# Patient Record
Sex: Female | Born: 1975 | Race: Black or African American | Hispanic: No | Smoking: Never smoker
Health system: Southern US, Community
[De-identification: ages and names within clinical notes are randomized; demographics above are authoritative.]

## PROBLEM LIST (undated history)

## (undated) DIAGNOSIS — E669 Obesity, unspecified: Secondary | ICD-10-CM

## (undated) DIAGNOSIS — F329 Major depressive disorder, single episode, unspecified: Secondary | ICD-10-CM

## (undated) DIAGNOSIS — D649 Anemia, unspecified: Secondary | ICD-10-CM

## (undated) DIAGNOSIS — J45909 Unspecified asthma, uncomplicated: Secondary | ICD-10-CM

## (undated) DIAGNOSIS — R519 Headache, unspecified: Secondary | ICD-10-CM

## (undated) DIAGNOSIS — G510 Bell's palsy: Secondary | ICD-10-CM

## (undated) DIAGNOSIS — R51 Headache: Secondary | ICD-10-CM

## (undated) DIAGNOSIS — Z8619 Personal history of other infectious and parasitic diseases: Secondary | ICD-10-CM

## (undated) DIAGNOSIS — G43909 Migraine, unspecified, not intractable, without status migrainosus: Secondary | ICD-10-CM

## (undated) DIAGNOSIS — F32A Depression, unspecified: Secondary | ICD-10-CM

## (undated) DIAGNOSIS — E559 Vitamin D deficiency, unspecified: Secondary | ICD-10-CM

## (undated) DIAGNOSIS — F419 Anxiety disorder, unspecified: Secondary | ICD-10-CM

## (undated) HISTORY — DX: Headache, unspecified: R51.9

## (undated) HISTORY — PX: COLONOSCOPY: SHX174

## (undated) HISTORY — DX: Unspecified asthma, uncomplicated: J45.909

## (undated) HISTORY — DX: Anxiety disorder, unspecified: F41.9

## (undated) HISTORY — DX: Headache: R51

## (undated) HISTORY — DX: Migraine, unspecified, not intractable, without status migrainosus: G43.909

## (undated) HISTORY — DX: Anemia, unspecified: D64.9

## (undated) HISTORY — DX: Bell's palsy: G51.0

## (undated) HISTORY — DX: Obesity, unspecified: E66.9

## (undated) HISTORY — DX: Personal history of other infectious and parasitic diseases: Z86.19

## (undated) HISTORY — DX: Vitamin D deficiency, unspecified: E55.9

## (undated) HISTORY — DX: Major depressive disorder, single episode, unspecified: F32.9

## (undated) HISTORY — DX: Depression, unspecified: F32.A

---

## 1999-11-25 ENCOUNTER — Inpatient Hospital Stay (HOSPITAL_COMMUNITY): Admission: AD | Admit: 1999-11-25 | Discharge: 1999-11-25 | Payer: Self-pay | Admitting: *Deleted

## 1999-11-28 ENCOUNTER — Inpatient Hospital Stay (HOSPITAL_COMMUNITY): Admission: AD | Admit: 1999-11-28 | Discharge: 1999-11-28 | Payer: Self-pay | Admitting: Obstetrics & Gynecology

## 1999-12-07 ENCOUNTER — Inpatient Hospital Stay (HOSPITAL_COMMUNITY): Admission: AD | Admit: 1999-12-07 | Discharge: 1999-12-07 | Payer: Self-pay | Admitting: *Deleted

## 2000-02-28 ENCOUNTER — Ambulatory Visit (HOSPITAL_COMMUNITY): Admission: RE | Admit: 2000-02-28 | Discharge: 2000-02-28 | Payer: Self-pay | Admitting: *Deleted

## 2000-04-04 ENCOUNTER — Ambulatory Visit (HOSPITAL_COMMUNITY): Admission: RE | Admit: 2000-04-04 | Discharge: 2000-04-04 | Payer: Self-pay | Admitting: *Deleted

## 2001-03-15 ENCOUNTER — Inpatient Hospital Stay (HOSPITAL_COMMUNITY): Admission: AD | Admit: 2001-03-15 | Discharge: 2001-03-15 | Payer: Self-pay | Admitting: Obstetrics & Gynecology

## 2001-06-03 ENCOUNTER — Other Ambulatory Visit: Admission: RE | Admit: 2001-06-03 | Discharge: 2001-06-03 | Payer: Self-pay | Admitting: Obstetrics

## 2001-06-03 ENCOUNTER — Encounter (INDEPENDENT_AMBULATORY_CARE_PROVIDER_SITE_OTHER): Payer: Self-pay | Admitting: Specialist

## 2005-10-09 ENCOUNTER — Other Ambulatory Visit: Admission: RE | Admit: 2005-10-09 | Discharge: 2005-10-09 | Payer: Self-pay | Admitting: Obstetrics and Gynecology

## 2005-11-23 ENCOUNTER — Emergency Department (HOSPITAL_COMMUNITY): Admission: EM | Admit: 2005-11-23 | Discharge: 2005-11-23 | Payer: Self-pay | Admitting: Emergency Medicine

## 2005-11-24 ENCOUNTER — Emergency Department (HOSPITAL_COMMUNITY): Admission: EM | Admit: 2005-11-24 | Discharge: 2005-11-24 | Payer: Self-pay | Admitting: Family Medicine

## 2005-11-24 ENCOUNTER — Ambulatory Visit (HOSPITAL_COMMUNITY): Admission: RE | Admit: 2005-11-24 | Discharge: 2005-11-24 | Payer: Self-pay | Admitting: Family Medicine

## 2007-09-10 ENCOUNTER — Encounter: Admission: RE | Admit: 2007-09-10 | Discharge: 2007-09-10 | Payer: Self-pay | Admitting: Gastroenterology

## 2007-10-27 ENCOUNTER — Emergency Department (HOSPITAL_COMMUNITY): Admission: EM | Admit: 2007-10-27 | Discharge: 2007-10-27 | Payer: Self-pay | Admitting: Emergency Medicine
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
## 2010-02-17 ENCOUNTER — Encounter: Admission: RE | Admit: 2010-02-17 | Discharge: 2010-03-17 | Payer: Self-pay | Admitting: Obstetrics and Gynecology

## 2010-06-09 ENCOUNTER — Inpatient Hospital Stay (HOSPITAL_COMMUNITY): Admission: RE | Admit: 2010-06-09 | Discharge: 2010-06-12 | Payer: Self-pay | Admitting: Obstetrics and Gynecology

## 2011-01-14 ENCOUNTER — Encounter: Payer: Self-pay | Admitting: Internal Medicine

## 2011-03-13 LAB — CBC
HCT: 28.1 % — ABNORMAL LOW (ref 36.0–46.0)
MCHC: 33.6 g/dL (ref 30.0–36.0)
Platelets: 156 10*3/uL (ref 150–400)
RBC: 3.29 MIL/uL — ABNORMAL LOW (ref 3.87–5.11)
RBC: 3.86 MIL/uL — ABNORMAL LOW (ref 3.87–5.11)
RDW: 14.8 % (ref 11.5–15.5)
WBC: 9.1 10*3/uL (ref 4.0–10.5)

## 2011-03-13 LAB — RPR: RPR Ser Ql: NONREACTIVE

## 2011-03-13 LAB — CCBB MATERNAL DONOR DRAW

## 2013-01-16 ENCOUNTER — Other Ambulatory Visit: Payer: Self-pay

## 2013-01-16 ENCOUNTER — Telehealth: Payer: Self-pay | Admitting: Obstetrics and Gynecology

## 2013-01-16 NOTE — Telephone Encounter (Signed)
Lm for pt to call back

## 2013-01-16 NOTE — Telephone Encounter (Signed)
TC TO PT. RE: B/C. PT STATES HER INSURANCE IS CHANGING TO MAIL ORDER AND I GAVE PT SAMPLES UNTIL AEX. PT APPRECIATED AND I TOLD PT TO MAKE SURE SHE KEEP AEX EXAM. PT VOICED UNDERSTANDING.

## 2013-02-24 ENCOUNTER — Ambulatory Visit: Payer: Self-pay | Admitting: Obstetrics and Gynecology

## 2013-02-26 ENCOUNTER — Ambulatory Visit: Payer: Self-pay | Admitting: Obstetrics and Gynecology

## 2013-03-17 ENCOUNTER — Other Ambulatory Visit: Payer: Self-pay | Admitting: Obstetrics and Gynecology

## 2013-03-18 LAB — HIV ANTIBODY (ROUTINE TESTING W REFLEX): HIV: NONREACTIVE

## 2013-03-18 LAB — RPR

## 2013-03-18 LAB — HEPATITIS C ANTIBODY: HCV Ab: NEGATIVE

## 2013-03-19 LAB — PAP IG, CT-NG, RFX HPV ASCU
Chlamydia Probe Amp: NEGATIVE
GC Probe Amp: NEGATIVE

## 2013-03-25 DIAGNOSIS — G510 Bell's palsy: Secondary | ICD-10-CM

## 2013-03-25 HISTORY — DX: Bell's palsy: G51.0

## 2013-04-14 ENCOUNTER — Other Ambulatory Visit: Payer: Self-pay

## 2013-04-14 ENCOUNTER — Other Ambulatory Visit: Payer: Self-pay | Admitting: Nurse Practitioner

## 2013-04-14 DIAGNOSIS — R51 Headache: Secondary | ICD-10-CM

## 2013-04-15 ENCOUNTER — Inpatient Hospital Stay: Admission: RE | Admit: 2013-04-15 | Payer: Self-pay | Source: Ambulatory Visit

## 2013-04-16 ENCOUNTER — Encounter: Payer: Self-pay | Admitting: Neurology

## 2013-04-16 ENCOUNTER — Ambulatory Visit (INDEPENDENT_AMBULATORY_CARE_PROVIDER_SITE_OTHER): Payer: BC Managed Care – PPO | Admitting: Neurology

## 2013-04-16 VITALS — BP 100/60 | HR 60 | Temp 98.1°F | Resp 12 | Ht 67.0 in | Wt 220.0 lb

## 2013-04-16 DIAGNOSIS — G510 Bell's palsy: Secondary | ICD-10-CM

## 2013-04-16 DIAGNOSIS — M5010 Cervical disc disorder with radiculopathy, unspecified cervical region: Secondary | ICD-10-CM

## 2013-04-16 DIAGNOSIS — M5412 Radiculopathy, cervical region: Secondary | ICD-10-CM

## 2013-04-16 MED ORDER — HYDROCODONE-ACETAMINOPHEN 5-325 MG PO TABS: 1.0000 | ORAL_TABLET | Freq: Four times a day (QID) | ORAL | Status: DC | PRN

## 2013-04-16 NOTE — Progress Notes (Signed)
Terry Ferguson is a 37 year old woman who has gone through a very stressful period of her life in recent months.  She notes that for the past 2 months she has had pain in her neck radiating into both shoulder areas.  In the past 6 weeks she has had intermittent tingling and numbness in the left arm that last for a couple of minutes this happened about 3 times in the last month.  One week ago she awoke and she noticed she couldn't swish mouthwash in her mouth and her mouth was open and quite correctly. Within a couple of hours, she couldn't talk correctly in the left side of the face was paralyzed.  She saw her dentist who sent her to the oral surgeon and they both gave her the diagnosis of Bell's palsy.  She started a six-day pack that day or the next day which he finished yesterday.  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  In the last today she's had pain behind the left ear radiating into the face and into the neck.  She was as called in to begin 60 mg of prednisone with taper as well as acyclovir.  She hasn't taken her first pill but she has it and is able to take it now or at lunch.  She also has loss of taste and a left-sided time and noises do seem upper and left ear, which would confirm that this is a facial nerve problem and not a brain originating the facial paralysis.  Also the involvement of the upper and lower face indicates this is a facial nerve problem and not a brain originating facial paralysis.  Because of the pain, she is having difficulty sleeping the last 2 nights and she can't lay on the left side because of this.  Review of systems is positive for occasional headaches and migraines as well as neck pain and occasional indigestion. She's also been having some stress feeling there was in her grandmother and other difficult situations that she's  been through. The remainder of review of systems is negative.  Past Medical History  Diagnosis Date  . Bell's palsy 03-2013    No current outpatient prescriptions on file prior to visit.   No current facility-administered medications on file prior to visit.   Erythromycin allergies History   Social History  . Marital Status: Married    Spouse Name: N/A    Number of Children: N/A  . Years of Education: N/A   Occupational History  . Not on file.   Social History Main Topics  . Smoking status: Never Smoker   . Smokeless tobacco: Never Used  . Alcohol Use: No     Comment: socially  . Drug Use: No  . Sexually Active: Not on file   Other Topics Concern  . Not on file   Social History Narrative  . No narrative on file   No family history on file.   BP 100/60  Pulse 60  Temp(Src) 98.1 F (36.7 C)  Resp 12  Ht 5\' 7"  (1.702 m)  Wt 220 lb (99.791 kg)  BMI 34.45 kg/m2   Alert and oriented x 3.  Memory function appears to be intact.  Concentration and attention are normal for educational level and background.  Speech is fluent and without significant word finding difficulty.  Is aware of current events.  No carotid bruits detected.  Cranial nerve II through XII reveals complete paraylsis of the left facial muscles in all divisions. normal optic discs and acuity, EOMI, PERLA, facialsensation intact, hearing grossly intact, gag intact,Uvula raises symmetrically and tongue protrudes evenly. Motor strength is 5 over 5 throughout all limbs.  No atrophy, abnormal tone or tremors. Reflexes are 2+ and symmetric in the upper and lower extremities Sensory exam is intact. Coordination is intact for fine movements and rapid alternating movements in all limbs Gait and station are normal.   Impression: 1.  Neck pain and intermittent tingling of the left arm suggesting possible cervical radiculopathy or cervical disc syndrome of some type. 2. Acute Bell's palsy one week in duration but  pain RE increasing even on a tapering dose of steroids.  She is complete facial paralysis and the recurrence of the pain would indicate significant swelling within the mastoid area of the cranium.  Plan: 1. She will resume steroids at 60 mg today with a taper 2. She will take Valtrex prescription beginning now. 3. We will take her out of work for 2 weeks due to the nature of her work requiring near continuous speech. 4. She'll see Dr. Lester Kinsman at 1:30 for acupuncture. 5. Return in 2 weeks

## 2013-04-16 NOTE — Patient Instructions (Addendum)
Follow up with Dr. Smiley Houseman in 2 weeks.  Acupuncture today at 1:30 pm as discussed.

## 2013-04-30 ENCOUNTER — Ambulatory Visit: Payer: BC Managed Care – PPO | Admitting: Neurology

## 2013-04-30 ENCOUNTER — Encounter: Payer: Self-pay | Admitting: Neurology

## 2013-04-30 ENCOUNTER — Ambulatory Visit (INDEPENDENT_AMBULATORY_CARE_PROVIDER_SITE_OTHER): Payer: BC Managed Care – PPO | Admitting: Neurology

## 2013-04-30 VITALS — BP 108/60 | HR 70 | Temp 98.0°F | Resp 12 | Wt 221.0 lb

## 2013-04-30 DIAGNOSIS — G510 Bell's palsy: Secondary | ICD-10-CM

## 2013-04-30 NOTE — Patient Instructions (Addendum)
Follow up with Dr. Soo in 6 weeks. 

## 2013-04-30 NOTE — Progress Notes (Signed)
Two-week followup for this case of severe left sided Bell's palsy.  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  She did go for one visit of acupuncture but has not been able to go back to shift. She was recommended to have a few visits for the best chance of improving her outcome.  She finished her second dose of steroids and she finished her Valtrex.  She has no more pain in the left although the face feels funny and her tongue feels a little too early in the left side.  She is having some right-sided headaches now and her TMJ joint pops once in a while.  Her vision cannula bit blurry. She is using a patch at night but no tape over the either Lacri-Lube yet.  She has paperwork for her disability and she also had lab work that was done on the outside including normal Lyme titers, sedimentation rate, CBC and her vitamin D level was low.  Review of systems is positive for some depression and anxiety and sleeping trouble.  Remainder of review of systems is unremarkable.  Past Medical History  Diagnosis Date  . Bell's palsy 03-2013  . Vitamin D deficiency     Current Outpatient Prescriptions on File Prior to Visit  Medication Sig Dispense Refill  . etonogestrel-ethinyl estradiol (NUVARING) 0.12-0.015 MG/24HR vaginal ring Place 1 each vaginally every 28 (twenty-eight) days. Insert vaginally and leave in place for 3 consecutive weeks, then remove for 1 week.      . Multiple Vitamin (MULTIVITAMIN) capsule Take 1 capsule by mouth daily.      . Probiotic Product (PROBIOTIC DAILY PO) Take by mouth.      Marland Kitchen HYDROcodone-acetaminophen (NORCO/VICODIN) 5-325 MG per tablet Take 1 tablet by mouth every 6 (six) hours as needed for pain.  30 tablet  0  . predniSONE (DELTASONE) 20 MG tablet Take 20 mg by mouth daily. Take three tabs a day.      . valACYclovir (VALTREX) 1000 MG tablet Take 1,000 mg by mouth 3 (three) times daily.       No current facility-administered medications on  file prior to visit.   Erythromycin  History   Social History  . Marital Status: Married    Spouse Name: N/A    Number of Children: N/A  . Years of Education: N/A   Occupational History  . Not on file.   Social History Main Topics  . Smoking status: Never Smoker   . Smokeless tobacco: Never Used  . Alcohol Use: No     Comment: socially  . Drug Use: No  . Sexually Active: Not on file   Other Topics Concern  . Not on file   Social History Narrative  . No narrative on file   No family history on file.   BP 108/60  Pulse 70  Temp(Src) 98 F (36.7 C)  Resp 12  Wt 221 lb (100.245 kg)  BMI 34.61 kg/m2    Alert and oriented x 3.  Memory function appears to be intact.  Concentration and attention are normal for educational level and background.  Speech is fluent but poorly enunciated for some words and without significant word finding difficulty.  Is aware of current events.  No carotid bruits detected.  Cranial nerve II through XII reveals no movement in the left facial muscles and the face is drawn over to the right. The right-sided facial muscles appear to be working normally at this time. Motor  strength is 5 over 5 throughout all limbs.  No atrophy, abnormal tone or tremors. Reflexes are 2+ and symmetric in the upper and lower extremities Sensory exam is intact. Coordination is intact for fine movements and rapid alternating movements in all limbs Gait and station are normal.   Impression: 1. Severe left Bell's palsy with no signs of increased motor movement as of yet and she still has some difficulty enunciating some words.  Plan: 1. Use Lacri-Lube at night and taper I shut under the patch. 2. Return acupuncture to complete her course of treatment. 3. 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 4. She can try over-the-counter vitamin D3, and she establishes with her primary doctor she can revisit this issue. 5. Return in 6  weeks to see if her speaking problem has improved and her for her facial weakness has improved 2 resulting in a change in her work status

## 2013-06-11 ENCOUNTER — Ambulatory Visit (INDEPENDENT_AMBULATORY_CARE_PROVIDER_SITE_OTHER): Payer: BC Managed Care – PPO | Admitting: Neurology

## 2013-06-11 ENCOUNTER — Telehealth: Payer: Self-pay | Admitting: Neurology

## 2013-06-11 ENCOUNTER — Encounter: Payer: Self-pay | Admitting: Neurology

## 2013-06-11 VITALS — BP 90/60 | HR 62 | Temp 97.6°F | Resp 12 | Wt 222.0 lb

## 2013-06-11 DIAGNOSIS — G510 Bell's palsy: Secondary | ICD-10-CM

## 2013-06-11 NOTE — Patient Instructions (Addendum)
We will see you back in two months.

## 2013-06-11 NOTE — Progress Notes (Signed)
Terry Ferguson returns for followup of her Bell's palsy now going on approximately 2 months.  She started to see some improved symmetry at the mouth and a little bit of improved closing of the eye and movement of the 4 head areas. Taste of the time still often there still is weakness of the left side of the face. She no longer is having a pain in the left mastoid area. She is having some pain in the right TMJ joint area.  Her sister has been reported missing under suspicious circumstances and that is mimicking stress on the family.  She has completed all of her courses of medication for the Bell's palsy but she still is having trouble enunciating her words on the phone and also she is now having a hoarse voice because all the people calling her and they can always hear her responses so she has to repeat herself a lot.  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  Therefore we will plan to extend her leave and hopefully she will be significant recovery within the next 6-8 weeks.  Review of systems is positive for some stress and tension in the shoulders as well as intention and the stomach area and occasional difficulty sleeping and the pain in the right TMJ area.  Past Medical History  Diagnosis Date  . Bell's palsy 03-2013  . Vitamin D deficiency     Current Outpatient Prescriptions on File Prior to Visit  Medication Sig Dispense Refill  . etonogestrel-ethinyl estradiol (NUVARING) 0.12-0.015 MG/24HR vaginal ring Place 1 each vaginally every 28 (twenty-eight) days. Insert vaginally and leave in place for 3 consecutive weeks, then remove for 1 week.      Marland Kitchen ibuprofen (ADVIL,MOTRIN) 800 MG tablet       . Multiple Vitamin (MULTIVITAMIN) capsule Take 1 capsule by mouth daily.      . Probiotic Product (PROBIOTIC DAILY PO) Take by mouth.      Marland Kitchen HYDROcodone-acetaminophen (NORCO/VICODIN) 5-325 MG per tablet Take 1  tablet by mouth every 6 (six) hours as needed for pain.  30 tablet  0   No current facility-administered medications on file prior to visit.    History   Social History  . Marital Status: Married    Spouse Name: N/A    Number of Children: N/A  . Years of Education: N/A   Occupational History  . Not on file.   Social History Main Topics  . Smoking status: Never Smoker   . Smokeless tobacco: Never Used  . Alcohol Use: No     Comment: socially  . Drug Use: No  . Sexually Active: Not on file   Other Topics Concern  . Not on file   Social History Narrative  . No narrative on file   No family history on file. Erythromycin  BP 90/60  Pulse 62  Temp(Src) 97.6 F (36.4 C)  Resp 12  Wt 222 lb (100.699 kg)  BMI 34.76 kg/m2   Alert and oriented x 3.  Memory function appears to be intact.  Concentration and attention are normal for educational level and background.  Speech is fluent and without significant word finding difficulty.  Is aware of current events.  No carotid bruits detected.  Cranial nerve II through XII reveals significant left facial weakness in all branches, but now a definite movement in the forehead muscles, and very slight movement around the left  side of the mouth and in the platysma muscle at only 10%.   normal optic discs and acuity, EOMI, PERLA, facial  sensation intact, hearing grossly intact, gag intact,Uvula raises symmetrically and tongue protrudes evenly. Motor strength is 5 over 5 throughout all limbs.  No atrophy, abnormal tone or tremors. Reflexes are 2+ and symmetric in the upper and lower extremities Sensory exam is intact. Coordination is intact for fine movements and rapid alternating movements in all limbs Gait and station are normal.   Impression: Bell's palsy with very severe weakness on the left and altered taste on the left side of the tongue now showing some signs of improvement especially in the upper facial muscles after 2 months.   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  Plan: 1. Return in 2 months at which time I hope she has 85-90% improvement and could return to work.  However, it is uncertain how quickly and to what degree she will improve

## 2013-07-29 NOTE — Telephone Encounter (Signed)
Close encounter 

## 2013-08-11 ENCOUNTER — Ambulatory Visit (INDEPENDENT_AMBULATORY_CARE_PROVIDER_SITE_OTHER): Payer: BC Managed Care – PPO | Admitting: Neurology

## 2013-08-11 ENCOUNTER — Encounter: Payer: Self-pay | Admitting: Neurology

## 2013-08-11 VITALS — BP 100/60 | HR 70 | Temp 97.9°F | Ht 66.0 in | Wt 219.0 lb

## 2013-08-11 DIAGNOSIS — G510 Bell's palsy: Secondary | ICD-10-CM

## 2013-08-11 DIAGNOSIS — M26609 Unspecified temporomandibular joint disorder, unspecified side: Secondary | ICD-10-CM

## 2013-08-11 MED ORDER — IBUPROFEN 800 MG PO TABS: 800.0000 mg | ORAL_TABLET | Freq: Four times a day (QID) | ORAL | Status: DC | PRN

## 2013-08-11 MED ORDER — BACLOFEN 10 MG PO TABS: 5.0000 mg | ORAL_TABLET | Freq: Three times a day (TID) | ORAL | Status: DC

## 2013-08-11 NOTE — Patient Instructions (Addendum)
1.  Continue the ibuprofen for acute headaches for now.  Take at earliest onset of the headache. 2.  Continue eye patch and eye drops.  If you notice any blurred vision, see an eye doctor. 3.  Follow up in 2 months.   Baclofen--take one half tablet three times a day as needed for muscle spasms.

## 2013-08-11 NOTE — Progress Notes (Signed)
NEUROLOGY FOLLOW UP OFFICE NOTE  Terry Ferguson [id number]  HISTORY OF PRESENT ILLNESS: Terry Ferguson is a 37 year old woman with history of migraines, anxiety and depression who presents for follow up regarding left sided Bell's palsy and neck pain.  She was previously followed by Dr. Murriel Hopper, who has since left the practice..  Records and images were personally reviewed where available.    Bell's palsy:  Woke up a couple of months ago with left-sided facial paralysis, loss of taste, difficulty talking, left sided hyperacusis, and pain.  She was given prednisone taper and acyclovir but didn't start it.  Lyme negative, ANA negative, ESR 22, RPR NR.  Vitamin D level a little low (17/1).  Neck pain:  Ongoing for 4 months.  Pain radiates to both shoulders.  Intermittent numbness and tingling in left arm.  She saw Dr. Smiley Houseman, who instructed her to take the prednisone and Valtrex.  She also was referred for acupuncture.  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  When she returned to the clinic, she did not have left sided facial pain, but still noted strange sensation.  She then complained of right sided headaches and that her TMJ "pops" once in a while.  She was instructed to continue acupuncture, use Lacri-Lube at night, along with eye patch, and vitamin D3.  She was kept out of work for another 2 weeks.  When she returned to clinic about 6 weeks later, she continued to see some improvement in facial weakness.  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  She continued to have trouble articulating her words, so she still was not able to resume work.  Her leave-of-absence was extended.   Since last visit, she has noticed some continued improvement.  Sensation and ear pain has improved, but she still has pulling of her mouth on the left.  She also endorses myokymia of the left eyelid.  She has to drink through a straw.  She will still have  occasional headaches, unilateral in the fronto-temporal region, on either side, throbbing, 6/10 intensity.  Sometimes she sees "stars" during the headache but denies nausea or photophobia.  She denies neck pain.  She takes ibuprofen 800mg , which usually helps, but it only dulled the pain a bit during her last headache last week.  She had about 5 headaches over the past 18 days.  They usually last all day.    She has been bereaving the recent death of her sister, who was tragically murdered.  She has since been started on bupropion.  PAST MEDICAL HISTORY: Past Medical History  Diagnosis Date  . Bell's palsy 03-2013  . Vitamin D deficiency     MEDICATIONS: Current Outpatient Prescriptions on File Prior to Visit  Medication Sig Dispense Refill  . etonogestrel-ethinyl estradiol (NUVARING) 0.12-0.015 MG/24HR vaginal ring Place 1 each vaginally every 28 (twenty-eight) days. Insert vaginally and leave in place for 3 consecutive weeks, then remove for 1 week.      Marland Kitchen HYDROcodone-acetaminophen (NORCO/VICODIN) 5-325 MG per tablet Take 1 tablet by mouth every 6 (six) hours as needed for pain.  30 tablet  0  . Multiple Vitamin (MULTIVITAMIN) capsule Take 1 capsule by mouth daily.      . Probiotic Product (PROBIOTIC DAILY PO) Take by mouth.      . Vitamin D, Ergocalciferol, (DRISDOL) 50000 UNITS CAPS        No current facility-administered medications on file prior  to visit.    ALLERGIES: Allergies  Allergen Reactions  . Erythromycin Hives    FAMILY HISTORY: Migraines: mother, grandmother  SOCIAL HISTORY: History   Social History  . Marital Status: Married    Spouse Name: N/A    Number of Children: N/A  . Years of Education: N/A   Occupational History  . Not on file.   Social History Main Topics  . Smoking status: Never Smoker   . Smokeless tobacco: Never Used  . Alcohol Use: No     Comment: socially  . Drug Use: No  . Sexual Activity: Not on file   Other Topics Concern  . Not  on file   Social History Narrative  . No narrative on file    PHYSICAL EXAM: Filed Vitals:   08/11/13 0846  BP: 100/60  Pulse: 70  Temp: 97.9 F (36.6 C)   General: No acute distress Head:  Normocephalic/atraumatic, tenderness and crepitus noted to palpation of TMJs Neck: supple, no paraspinal tenderness, full range of motion Back: No paraspinal tenderness Neurological Exam: alert and oriented to person, place, and time, speech fluent and not dysarthric, language intact.  PERRL, EOMI, left upper and lower facial weakness, facial sensation intact, tongue midline, left hyperacusis, shoulder shrug intact, bulk and tone normal, muscle strength 5/5 throughout, sensation to light touch, temperature and vibration intact, deep tendon reflexes 2+ throughout, toes downgoing, finger to nose and heel to shin testing intact, gait normal, Romberg negative.  IMPRESSION & PLAN: 1.  Left-sided Bell's palsy, complicated by left paretic facial contracture.  Improvement of symptoms suggest she still can have continued improvement in symptoms.  However, I think she will have residual deficits.  - continue eye patch and Lacri-Lube at night.  If experience change in vision, see ophthalmologist.  - try baclofen 5mg  TID for facial muscle spasms.  Side effects such as sedation discussed, so she should be careful driving. 2.  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  - continue ibuprofen for acute pain.  Take at onset of headache and not wait until it gets worse.  - if headaches persist or get worse, consider adding amitriptyline 10mg  daily for preventative (in addition to or in place of bupropion?). 3.  Follow up in 2 months or as needed.  45 minutes spent with patient, over 50% spent reviewing the chart, counseling and coordinating care.   Shon Millet, DO  CC:  Dorothyann Peng, MD

## 2013-10-14 ENCOUNTER — Ambulatory Visit: Payer: BC Managed Care – PPO | Admitting: Neurology

## 2017-05-01 DIAGNOSIS — M546 Pain in thoracic spine: Secondary | ICD-10-CM | POA: Diagnosis not present

## 2017-05-01 DIAGNOSIS — M9903 Segmental and somatic dysfunction of lumbar region: Secondary | ICD-10-CM | POA: Diagnosis not present

## 2017-05-01 DIAGNOSIS — M5416 Radiculopathy, lumbar region: Secondary | ICD-10-CM | POA: Diagnosis not present

## 2017-05-01 DIAGNOSIS — M542 Cervicalgia: Secondary | ICD-10-CM | POA: Diagnosis not present

## 2017-05-16 DIAGNOSIS — M542 Cervicalgia: Secondary | ICD-10-CM | POA: Diagnosis not present

## 2017-05-16 DIAGNOSIS — M5416 Radiculopathy, lumbar region: Secondary | ICD-10-CM | POA: Diagnosis not present

## 2017-05-16 DIAGNOSIS — M9903 Segmental and somatic dysfunction of lumbar region: Secondary | ICD-10-CM | POA: Diagnosis not present

## 2017-05-16 DIAGNOSIS — M546 Pain in thoracic spine: Secondary | ICD-10-CM | POA: Diagnosis not present

## 2017-06-21 DIAGNOSIS — Z01419 Encounter for gynecological examination (general) (routine) without abnormal findings: Secondary | ICD-10-CM | POA: Diagnosis not present

## 2017-06-21 DIAGNOSIS — Z3040 Encounter for surveillance of contraceptives, unspecified: Secondary | ICD-10-CM | POA: Diagnosis not present

## 2017-06-21 DIAGNOSIS — Z113 Encounter for screening for infections with a predominantly sexual mode of transmission: Secondary | ICD-10-CM | POA: Diagnosis not present

## 2017-06-21 DIAGNOSIS — Z6838 Body mass index (BMI) 38.0-38.9, adult: Secondary | ICD-10-CM | POA: Diagnosis not present

## 2017-06-21 DIAGNOSIS — R61 Generalized hyperhidrosis: Secondary | ICD-10-CM | POA: Diagnosis not present

## 2017-06-21 DIAGNOSIS — B009 Herpesviral infection, unspecified: Secondary | ICD-10-CM | POA: Diagnosis not present

## 2017-08-02 DIAGNOSIS — L718 Other rosacea: Secondary | ICD-10-CM | POA: Diagnosis not present

## 2017-08-02 DIAGNOSIS — L304 Erythema intertrigo: Secondary | ICD-10-CM | POA: Diagnosis not present

## 2017-08-02 DIAGNOSIS — L858 Other specified epidermal thickening: Secondary | ICD-10-CM | POA: Diagnosis not present

## 2017-08-06 DIAGNOSIS — Z13220 Encounter for screening for lipoid disorders: Secondary | ICD-10-CM | POA: Diagnosis not present

## 2017-08-06 DIAGNOSIS — Z0000 Encounter for general adult medical examination without abnormal findings: Secondary | ICD-10-CM | POA: Diagnosis not present

## 2017-08-06 DIAGNOSIS — Z131 Encounter for screening for diabetes mellitus: Secondary | ICD-10-CM | POA: Diagnosis not present

## 2017-12-04 ENCOUNTER — Ambulatory Visit: Payer: BC Managed Care – PPO | Admitting: Family Medicine

## 2018-08-19 DIAGNOSIS — Z01419 Encounter for gynecological examination (general) (routine) without abnormal findings: Secondary | ICD-10-CM | POA: Diagnosis not present

## 2018-08-19 DIAGNOSIS — Z6838 Body mass index (BMI) 38.0-38.9, adult: Secondary | ICD-10-CM | POA: Diagnosis not present

## 2018-08-19 DIAGNOSIS — Z113 Encounter for screening for infections with a predominantly sexual mode of transmission: Secondary | ICD-10-CM | POA: Diagnosis not present

## 2018-08-19 DIAGNOSIS — Z3040 Encounter for surveillance of contraceptives, unspecified: Secondary | ICD-10-CM | POA: Diagnosis not present

## 2018-10-09 ENCOUNTER — Ambulatory Visit: Payer: BLUE CROSS/BLUE SHIELD | Admitting: Family Medicine

## 2018-10-09 ENCOUNTER — Encounter: Payer: Self-pay | Admitting: Family Medicine

## 2018-10-09 VITALS — BP 123/82 | HR 60 | Temp 98.3°F | Resp 12 | Ht 66.0 in | Wt 244.1 lb

## 2018-10-09 DIAGNOSIS — R519 Headache, unspecified: Secondary | ICD-10-CM

## 2018-10-09 DIAGNOSIS — K5900 Constipation, unspecified: Secondary | ICD-10-CM

## 2018-10-09 DIAGNOSIS — R252 Cramp and spasm: Secondary | ICD-10-CM | POA: Diagnosis not present

## 2018-10-09 DIAGNOSIS — F331 Major depressive disorder, recurrent, moderate: Secondary | ICD-10-CM

## 2018-10-09 DIAGNOSIS — D509 Iron deficiency anemia, unspecified: Secondary | ICD-10-CM | POA: Insufficient documentation

## 2018-10-09 DIAGNOSIS — F332 Major depressive disorder, recurrent severe without psychotic features: Secondary | ICD-10-CM | POA: Insufficient documentation

## 2018-10-09 DIAGNOSIS — R51 Headache: Secondary | ICD-10-CM

## 2018-10-09 MED ORDER — BUPROPION HCL ER (XL) 150 MG PO TB24: 150.0000 mg | ORAL_TABLET | Freq: Every day | ORAL | 1 refills | Status: DC

## 2018-10-09 NOTE — Patient Instructions (Addendum)
A few things to remember from today's visit:   Depression, major, recurrent, moderate (HCC) - Plan: buPROPion (WELLBUTRIN XL) 150 MG 24 hr tablet  Bilateral leg cramps - Plan: CBC, Basic metabolic panel, TSH, Magnesium  Severe obesity (BMI 35.0-39.9) with comorbidity (HCC)   Please be sure medication list is accurate. If a new problem present, please set up appointment sooner than planned today.

## 2018-10-09 NOTE — Progress Notes (Addendum)
HPI:   [name] is a 42 y.o. female, who is here today to establish care.  Former PCP: 07/2017 Last preventive routine visit: Gynecologic preventive care 07/2018.  Chronic medical problems: Vitamin D deficiency, anxiety,right Bell's palsy (2014).  Lab Results  Component Value Date   WBC 9.1 06/10/2010   HGB 9.5 (L) 06/10/2010   HCT 28.1 (L) 06/10/2010   MCV 85.6 06/10/2010   PLT 156 06/10/2010   She is on constant NuvaRing, so she does not have menstrual bleeding.   Concerns today: "Digestive issues,really bad headaches",cramps,and rash (sometimes, does not have it now).   Constipation with intermittent blood on tissue upon straining. No dyschezia. 2-3 bowel movements per month.  Mild diffuse abdominal cramps,alleviated by defecation. She has not identified exacerbating factors.She is not sure if it may be related with stress.  Reporting "migraines", having headaches 2-3 times per months. Headaches have improved overtime.   Frontal and parietal headache,pressure like,moderate to severe. No associated nausea,vomiting,or photophobia. She takes Excedrin migraine,which helps. Excedrin tension headache does not help.  Hx of iron def anemia, she is not on iron supplementation. + Fatigue.  Leg cramps, which she has "always" had. Tight and pulling leg sensation, it is becoming more frequent. She read on line that this could be related to low Mg. Interfere with sleep, happens about 2-3 times per week. She has not identified exacerbating or alleviating factors. She has not noted edema or erythema. She has tried home remedies but have not helped.  Depression and anxiety: Dx a couple of years ago after the murder of her sister. She denies suicidal thoughts. Divorced ,she lives with her 2 children.  She tried Wellbutrin in the past but she thinks dose was not high enough. Lexapro caused wt gain.  She would like something that does not  cause wt gain. She has not been consistent with a healthful diet or regular physical activity.   Depression screen PHQ 2/9 10/09/2018  Decreased Interest 3  Down, Depressed, Hopeless 2  PHQ - 2 Score 5  Altered sleeping 3  Tired, decreased energy 3  Change in appetite 3  Feeling bad or failure about yourself  3  Trouble concentrating 1  Moving slowly or fidgety/restless 0  Suicidal thoughts 0  PHQ-9 Score 18  Difficult doing work/chores Extremely dIfficult    Recently she had biometric labs at work,she brought results. FLP with LDL 126, rest in normal range. FG 76.  Review of Systems  Constitutional: Positive for fatigue. Negative for activity change, appetite change, fever and unexpected weight change.  HENT: Negative for mouth sores, nosebleeds and trouble swallowing.   Eyes: Negative for redness and visual disturbance.  Respiratory: Negative for cough, shortness of breath and wheezing.   Cardiovascular: Negative for chest pain, palpitations and leg swelling.  Gastrointestinal: Positive for constipation. Negative for abdominal pain, nausea and vomiting.       Negative for changes in bowel habits.  Genitourinary: Negative for decreased urine volume, dysuria and hematuria.  Musculoskeletal: Positive for myalgias. Negative for gait problem.  Skin: Negative for color change and rash.  Neurological: Positive for headaches. Negative for syncope, weakness and numbness.  Psychiatric/Behavioral: Negative for confusion and suicidal ideas. The patient is nervous/anxious.       Current Outpatient Medications on File Prior to Visit  Medication Sig Dispense Refill  . Aspirin-Acetaminophen-Caffeine (EXCEDRIN MIGRAINE PO) Take by mouth as needed.    . etonogestrel-ethinyl estradiol (NUVARING) 0.12-0.015 MG/24HR vaginal ring  Place 1 each vaginally every 28 (twenty-eight) days. Insert vaginally and leave in place for 3 consecutive weeks, then remove for 1 week.    . Probiotic Product  (PROBIOTIC DAILY PO) Take by mouth.     No current facility-administered medications on file prior to visit.      Past Medical History:  Diagnosis Date  . Asthma   . Bell's palsy 03-2013  . Bell's palsy   . Depression   . Frequent headaches   . History of chicken pox   . Migraines   . Vitamin D deficiency    Allergies  Allergen Reactions  . Erythromycin Hives    Family History  Problem Relation Age of Onset  . Migraines Mother   . Arthritis Mother   . Depression Mother   . Drug abuse Mother   . Migraines Maternal Grandmother   . Arthritis Maternal Grandmother   . COPD Maternal Grandmother   . Depression Maternal Grandmother   . Hearing loss Maternal Grandmother   . Depression Father   . Depression Sister   . Asthma Sister   . Early death Sister   . Asthma Daughter   . Asthma Son   . Birth defects Son   . Arthritis Maternal Grandfather   . Heart disease Maternal Grandfather   . Arthritis Paternal Grandmother   . Depression Sister     Social History   Socioeconomic History  . Marital status: Divorced    Spouse name: Not on file  . Number of children: 2  . Years of education: Not on file  . Highest education level: Not on file  Occupational History  . Not on file  Social Needs  . Financial resource strain: Not on file  . Food insecurity:    Worry: Not on file    Inability: Not on file  . Transportation needs:    Medical: Not on file    Non-medical: Not on file  Tobacco Use  . Smoking status: Never Smoker  . Smokeless tobacco: Never Used  Substance and Sexual Activity  . Alcohol use: Yes    Comment: socially  . Drug use: No  . Sexual activity: Not Currently  Lifestyle  . Physical activity:    Days per week: Not on file    Minutes per session: Not on file  . Stress: Not on file  Relationships  . Social connections:    Talks on phone: Not on file    Gets together: Not on file    Attends religious service: Not on file    Active member of club  or organization: Not on file    Attends meetings of clubs or organizations: Not on file    Relationship status: Not on file  Other Topics Concern  . Not on file  Social History Narrative  . Not on file    Vitals:   10/09/18 1550  BP: 123/82  Pulse: 60  Resp: 12  Temp: 98.3 F (36.8 C)  SpO2: 99%    Body mass index is 39.4 kg/m.   Physical Exam  Nursing note and vitals reviewed. Constitutional: She is oriented to person, place, and time. She appears well-developed. No distress.  HENT:  Head: Normocephalic and atraumatic.  Mouth/Throat: Oropharynx is clear and moist and mucous membranes are normal.  Eyes: Pupils are equal, round, and reactive to light. Conjunctivae are normal.  Cardiovascular: Normal rate and regular rhythm.  No murmur heard. Pulses:      Dorsalis pedis pulses are 2+  on the right side, and 2+ on the left side.  Respiratory: Effort normal and breath sounds normal. No respiratory distress.  GI: Soft. She exhibits no mass. There is no hepatomegaly. There is no tenderness.  Musculoskeletal: She exhibits no edema or tenderness.       Cervical back: She exhibits no tenderness and no bony tenderness.  Lymphadenopathy:    She has no cervical adenopathy.  Neurological: She is alert and oriented to person, place, and time. Gait normal.  No focal deficit except for left-sided Bell's palsy.  Skin: Skin is warm. No rash noted. No erythema.  Psychiatric: Her mood appears anxious. Her affect is labile.  Well groomed, good eye contact.     ASSESSMENT AND PLAN:  Ms. Lynnel was seen today for establish care.  Orders Placed This Encounter  Procedures  . CBC  . Basic metabolic panel  . TSH  . Magnesium  . CK   Lab Results  Component Value Date   CREATININE 0.88 10/09/2018   BUN 10 10/09/2018   NA 138 10/09/2018   K 4.0 10/09/2018   CL 107 10/09/2018   CO2 23 10/09/2018    Lab Results  Component Value Date   TSH 1.81 10/09/2018   Lab Results    Component Value Date   WBC 6.7 10/09/2018   HGB 10.9 (L) 10/09/2018   HCT 34.1 (L) 10/09/2018   MCV 82.1 10/09/2018   PLT 276.0 10/09/2018    Constipation, unspecified constipation type  Chronic. ? IBS. OTC Miralax daily as needed and Benefiber bid. Adequate hydration and fiber intake.  -     TSH  Bilateral leg cramps  Possible etiologies discussed. She prefers to hold on trial of muscle relaxant or Gabapentin. Further recommendations will be given according to lab results.  -     CBC -     Basic metabolic panel -     TSH -     Magnesium -     CK  Severe obesity (BMI 35.0-39.9) with comorbidity (HCC)  We discussed benefits of wt loss as well as adverse effects of obesity. Consistency with healthy diet and physical activity recommended. Daily brisk walking for 15-30 min as tolerated.  Depression, major, recurrent, moderate (HCC)  After discussing pharmacologic treatment options,she would like to try Wellbutrin again. Instructed about warning signs. F/U in 6 weeks.  -     buPROPion (WELLBUTRIN XL) 150 MG 24 hr tablet; Take 1 tablet (150 mg total) by mouth daily.  Headache, unspecified headache type  Hx sounds like tension headache. Treatment options discussed. After discussion of side effects she prefers to hold on Amitriptyline. F/U in 6 weeks.  Iron deficiency anemia, unspecified iron deficiency anemia type  Further recommendations will be given according to CBC results.  -     CBC     Betty G. [place], MD  [facility]. [place] office.

## 2018-10-10 LAB — BASIC METABOLIC PANEL
BUN: 10 mg/dL (ref 6–23)
CALCIUM: 9.3 mg/dL (ref 8.4–10.5)
CO2: 23 mEq/L (ref 19–32)
Chloride: 107 mEq/L (ref 96–112)
Creatinine, Ser: 0.88 mg/dL (ref 0.40–1.20)
GFR: 91.58 mL/min (ref >60.00)
GLUCOSE: 80 mg/dL (ref 70–99)
Potassium: 4 mEq/L (ref 3.5–5.1)
SODIUM: 138 meq/L (ref 135–145)

## 2018-10-10 LAB — TSH: TSH: 1.81 u[IU]/mL (ref 0.35–4.50)

## 2018-10-10 LAB — CBC
HEMATOCRIT: 34.1 % — AB (ref 36.0–46.0)
Hemoglobin: 10.9 g/dL — ABNORMAL LOW (ref 12.0–15.0)
MCHC: 32.1 g/dL (ref 30.0–36.0)
MCV: 82.1 fl (ref 78.0–100.0)
PLATELETS: 276 10*3/uL (ref 150.0–400.0)
RBC: 4.15 Mil/uL (ref 3.87–5.11)
RDW: 13.6 % (ref 11.5–15.5)
WBC: 6.7 10*3/uL (ref 4.0–10.5)

## 2018-10-10 LAB — MAGNESIUM: Magnesium: 2.2 mg/dL (ref 1.5–2.5)

## 2018-10-10 LAB — CK: CK TOTAL: 117 U/L (ref 7–177)

## 2018-10-12 ENCOUNTER — Encounter: Payer: Self-pay | Admitting: Family Medicine

## 2018-10-12 MED ORDER — FERROUS SULFATE 325 (65 FE) MG PO TABS: 325.0000 mg | ORAL_TABLET | Freq: Every day | ORAL | 3 refills | Status: DC

## 2018-11-20 ENCOUNTER — Ambulatory Visit: Payer: BLUE CROSS/BLUE SHIELD | Admitting: Family Medicine

## 2018-11-20 ENCOUNTER — Encounter: Payer: Self-pay | Admitting: Family Medicine

## 2018-11-20 VITALS — BP 124/82 | HR 68 | Temp 98.3°F | Resp 12 | Ht 66.0 in | Wt 239.1 lb

## 2018-11-20 DIAGNOSIS — D509 Iron deficiency anemia, unspecified: Secondary | ICD-10-CM | POA: Diagnosis not present

## 2018-11-20 DIAGNOSIS — N938 Other specified abnormal uterine and vaginal bleeding: Secondary | ICD-10-CM

## 2018-11-20 DIAGNOSIS — R519 Headache, unspecified: Secondary | ICD-10-CM

## 2018-11-20 DIAGNOSIS — F331 Major depressive disorder, recurrent, moderate: Secondary | ICD-10-CM

## 2018-11-20 DIAGNOSIS — K5900 Constipation, unspecified: Secondary | ICD-10-CM

## 2018-11-20 DIAGNOSIS — R51 Headache: Secondary | ICD-10-CM | POA: Diagnosis not present

## 2018-11-20 MED ORDER — CITALOPRAM HYDROBROMIDE 10 MG PO TABS: 10.0000 mg | ORAL_TABLET | Freq: Every day | ORAL | 2 refills | Status: DC

## 2018-11-20 NOTE — Assessment & Plan Note (Signed)
She has not had any since her last visit. Instructed about warning signs. Follow-up as needed.

## 2018-11-20 NOTE — Assessment & Plan Note (Addendum)
After discussion of some Rx pharmacologic options + side effects, she would like to hold on Linzess. She would like to try OTC options, recommend MiraLAX and bisacodyl 5 mg daily as needed. Adequate fiber and fluid intake.

## 2018-11-20 NOTE — Assessment & Plan Note (Addendum)
She has lost about 5 pounds since 09/2018. We discussed benefits of wt loss as well as adverse effects of obesity. Consistency with healthy diet and physical activity recommended.   Encouraged to continue a healthful diet. Recommend regular exercise. Follow-up in 4 months.

## 2018-11-20 NOTE — Assessment & Plan Note (Signed)
Improved. She has had some irritability in the afternoon, anxiety. She agrees with adding Celexa 10 mg daily. Instructed about warning signs. No changes in Wellbutrin. Instructed to let me know in about 6 weeks how she is doing with Celexa. Follow-up in 4 months, before if needed.

## 2018-11-20 NOTE — Progress Notes (Signed)
HPI:   Terry Ferguson is a 42 y.o. female, who is here today to follow on recent OV.    She was last seen on 10/09/2018, at that time she was started on Wellbutrin later aches letter and 150 mg daily. She was also complaining about constipation, recommended adequate fiber and fluid intake as well as MiraLAX.  She did not try MiraLAX. Wellbutrin seems to be aggravating constipation. Last bowel movement about a week ago. Occasionally she sees blood on tissue, usually when stool is "very hard."  This does not happen frequently.  Anxiety and depression: It seems like Wellbutrin is helping , wonders if dose needs to be increased because she is very irritable in the afternoon.  Denies suicidal thoughts.  She was also complaining of headache, thought to be more like tension headache.  She has not had any episodes of headaches since her last visit.   Anemia: She is not on ferrous sulfate.  Lab Results  Component Value Date   WBC 6.7 10/09/2018   HGB 10.9 (L) 10/09/2018   HCT 34.1 (L) 10/09/2018   MCV 82.1 10/09/2018   PLT 276.0 10/09/2018   She has history of heavy menses but has not had this problem since she started on OCPs, a year ago. LMP over a year ago.  She also mentions that for the past few weeks she has had random episodes of vaginal bleeding that last a day   Denies sexual activity.  -Since her last OV she decreased carbs intake and increased vegetable and salads. She is not exercising regularly.   Review of Systems  Constitutional: Negative for activity change, appetite change, chills, fatigue and fever.  HENT: Negative for mouth sores, nosebleeds and trouble swallowing.   Respiratory: Negative for cough, shortness of breath and wheezing.   Cardiovascular: Negative for palpitations and leg swelling.  Gastrointestinal: Positive for constipation. Negative for abdominal pain, nausea and vomiting.  Endocrine: Negative for cold  intolerance and heat intolerance.  Genitourinary: Positive for vaginal bleeding. Negative for decreased urine volume, dysuria and hematuria.  Musculoskeletal: Negative for gait problem and myalgias.  Skin: Negative for color change and rash.  Neurological: Negative for weakness and headaches.  Psychiatric/Behavioral: Negative for confusion and sleep disturbance. The patient is nervous/anxious.       Current Outpatient Medications on File Prior to Visit  Medication Sig Dispense Refill  . Aspirin-Acetaminophen-Caffeine (EXCEDRIN MIGRAINE PO) Take by mouth as needed.    Marland Kitchen buPROPion (WELLBUTRIN XL) 150 MG 24 hr tablet Take 1 tablet (150 mg total) by mouth daily. 30 tablet 1  . etonogestrel-ethinyl estradiol (NUVARING) 0.12-0.015 MG/24HR vaginal ring Place 1 each vaginally every 28 (twenty-eight) days. Insert vaginally and leave in place for 3 consecutive weeks, then remove for 1 week.    . ferrous sulfate 325 (65 FE) MG tablet Take 1 tablet (325 mg total) by mouth daily. 90 tablet 3  . Probiotic Product (PROBIOTIC DAILY PO) Take by mouth.     No current facility-administered medications on file prior to visit.      Past Medical History:  Diagnosis Date  . Asthma   . Bell's palsy 03-2013  . Bell's palsy   . Depression   . Frequent headaches   . History of chicken pox   . Migraines   . Vitamin D deficiency    Allergies  Allergen Reactions  . Erythromycin Hives  . Erythromycin Base     Social History   Socioeconomic History  .  Marital status: Divorced    Spouse name: Not on file  . Number of children: 2  . Years of education: Not on file  . Highest education level: Not on file  Occupational History  . Not on file  Social Needs  . Financial resource strain: Not on file  . Food insecurity:    Worry: Not on file    Inability: Not on file  . Transportation needs:    Medical: Not on file    Non-medical: Not on file  Tobacco Use  . Smoking status: Never Smoker  .  Smokeless tobacco: Never Used  Substance and Sexual Activity  . Alcohol use: Yes    Comment: socially  . Drug use: No  . Sexual activity: Not Currently  Lifestyle  . Physical activity:    Days per week: Not on file    Minutes per session: Not on file  . Stress: Not on file  Relationships  . Social connections:    Talks on phone: Not on file    Gets together: Not on file    Attends religious service: Not on file    Active member of club or organization: Not on file    Attends meetings of clubs or organizations: Not on file    Relationship status: Not on file  Other Topics Concern  . Not on file  Social History Narrative  . Not on file    Vitals:   11/20/18 1011  BP: 124/82  Pulse: 68  Resp: 12  Temp: 98.3 F (36.8 C)  SpO2: 98%   Body mass index is 38.6 kg/m.  Wt Readings from Last 3 Encounters:  11/20/18 239 lb 2 oz (108.5 kg)  10/09/18 244 lb 2 oz (110.7 kg)  08/11/13 219 lb (99.3 kg)     Physical Exam  Nursing note and vitals reviewed. Constitutional: She is oriented to person, place, and time. She appears well-developed. She does not appear ill. No distress.  HENT:  Head: Normocephalic and atraumatic.  Mouth/Throat: Oropharynx is clear and moist and mucous membranes are normal.  Eyes: Conjunctivae are normal.  Cardiovascular: Normal rate and regular rhythm.  No murmur heard. Respiratory: Effort normal and breath sounds normal. No respiratory distress.  GI: Soft. Bowel sounds are normal. She exhibits no distension and no mass. There is no hepatomegaly. There is no tenderness.  Musculoskeletal: She exhibits no edema.  Lymphadenopathy:    She has no cervical adenopathy.       Right: No supraclavicular adenopathy present.       Left: No supraclavicular adenopathy present.  Neurological: She is alert and oriented to person, place, and time. Gait normal.  Left facial palsy, rest normal strength.  Skin: Skin is warm. No rash noted. No erythema.  Psychiatric:  Her mood appears anxious.  Well groomed, good eye contact.    ASSESSMENT AND PLAN:  Terry Ferguson was seen today for medication follow-up.  Orders Placed This Encounter  Procedures  . Ambulatory referral to Gastroenterology     Iron deficiency anemia, unspecified iron deficiency anemia type She has not had menses for a year. Recommend GI evaluation.  - Ambulatory referral to Gastroenterology  Severe obesity (BMI 35.0-39.9) with comorbidity (HCC) She has lost about 5 pounds since 09/2018. We discussed benefits of wt loss as well as adverse effects of obesity. Consistency with healthy diet and physical activity recommended.   Encouraged to continue a healthful diet. Recommend regular exercise. Follow-up in 4 months.  Depression, major, recurrent, moderate (HCC) Improved.  She has had some irritability in the afternoon, anxiety. She agrees with adding Celexa 10 mg daily. Instructed about warning signs. No changes in Wellbutrin. Instructed to let me know in about 6 weeks how she is doing with Celexa. Follow-up in 4 months, before if needed.  Headache, unspecified headache type She has not had any since her last visit. Instructed about warning signs. Follow-up as needed.  Constipation After discussion of some Rx pharmacologic options + side effects, she would like to hold on Linzess. She would like to try OTC options, recommend MiraLAX and bisacodyl 5 mg daily as needed. Adequate fiber and fluid intake.   DUB (dysfunctional uterine bleeding) Strongly recommend following with her gyn in the next few days due to post menopausal bleeding.      Betty G. [place], MD  [facility]. [place] office.

## 2018-11-20 NOTE — Patient Instructions (Addendum)
A few things to remember from today's visit:   Constipation, unspecified constipation type - Plan: Ambulatory referral to Gastroenterology  Depression, major, recurrent, moderate (HCC) - Plan: citalopram (CELEXA) 10 MG tablet  Headache, unspecified headache type  Iron deficiency anemia, unspecified iron deficiency anemia type - Plan: Ambulatory referral to Gastroenterology Celexa 10 mg added today. No changes in Wellbutrin. Please let me know in 6 weeks how you are doing with the Celexa. Please call your gynecologist office to let them know about your vaginal bleeding.  For constipation try MiraLAX daily and bisacodyl 5 mg daily as needed. If over-the-counter medications do not help with constipation please let me know, we can consider Linzess  I will see you back in 4 months.  Please be sure medication list is accurate. If a new problem present, please set up appointment sooner than planned today.

## 2018-11-23 ENCOUNTER — Encounter: Payer: Self-pay | Admitting: Family Medicine

## 2018-12-09 ENCOUNTER — Encounter: Payer: Self-pay | Admitting: Gastroenterology

## 2018-12-19 ENCOUNTER — Ambulatory Visit (AMBULATORY_SURGERY_CENTER): Payer: Self-pay | Admitting: *Deleted

## 2018-12-19 ENCOUNTER — Telehealth: Payer: Self-pay | Admitting: *Deleted

## 2018-12-19 VITALS — Ht 66.5 in | Wt 241.8 lb

## 2018-12-19 DIAGNOSIS — K5900 Constipation, unspecified: Secondary | ICD-10-CM

## 2018-12-19 DIAGNOSIS — D508 Other iron deficiency anemias: Secondary | ICD-10-CM

## 2018-12-19 MED ORDER — NA SULFATE-K SULFATE-MG SULF 17.5-3.13-1.6 GM/177ML PO SOLN
ORAL | 0 refills | Status: DC
Start: 2018-12-19 — End: 2020-03-23

## 2018-12-19 NOTE — Progress Notes (Signed)
Patient denies any allergies to eggs or soy. Patient denies any problems with anesthesia/sedation. Patient denies any oxygen use at home. Patient denies taking any diet/weight loss medications or blood thinners. 2 day prep given to pt.

## 2018-12-19 NOTE — Telephone Encounter (Signed)
Patient is 42 y.o. Looks like she was referred to you for direct colonoscopy for Iron def. Anemia and constipation. She had her pre-visit today and states she is having irregular and heavy "vaginal bleeding and was placed on birth control", she denies rectal bleeding. Patient does state she has about "2 BMs per month". Patient wants to have the colonoscopy. I did the pre-visit and did give her 2 day prep with Miralax and Suprep. Hbg was low, pt is not taking her IRON. Ok to have direct colonoscopy for IDA and constipation or OV needed? Please advise. Thank you,Robbin pv

## 2018-12-20 ENCOUNTER — Encounter: Payer: Self-pay | Admitting: Gastroenterology

## 2018-12-25 DIAGNOSIS — Z8616 Personal history of COVID-19: Secondary | ICD-10-CM

## 2018-12-25 HISTORY — DX: Personal history of COVID-19: Z86.16

## 2018-12-25 NOTE — Telephone Encounter (Signed)
I typically like to see these patients in the office first given the active GI symptoms.  Please schedule an OV with me.  Thank you.

## 2018-12-26 ENCOUNTER — Telehealth: Payer: Self-pay | Admitting: Gastroenterology

## 2018-12-26 NOTE — Telephone Encounter (Signed)
Pt called in and advised during pre visit she was told by nurse that the nurse will check with the doctor to see if a colon was actually needed. Pt has not heard back from nurse and would like a call to follow up

## 2018-12-26 NOTE — Telephone Encounter (Signed)
PER DR CIRIGLIANO PT NEEDS AN OV- OV SCHEDULED WITH PT  FOR 1-9 AT 330 PM WITH CIRIGLIANO- I LEFT THE 1-31 COLON AS SCHEDULED IN CASE HE DECIDES TO PROCEED- IF HE DOES NOT WANT COLON, IT WILL NEED TO BE CANCELLED AFTER THE 1-9 OV.   MARIE PV

## 2018-12-27 ENCOUNTER — Encounter: Payer: Self-pay | Admitting: Gastroenterology

## 2018-12-31 NOTE — Telephone Encounter (Signed)
See additional phone notes about this.

## 2019-01-01 ENCOUNTER — Encounter: Payer: BLUE CROSS/BLUE SHIELD | Admitting: Gastroenterology

## 2019-01-02 ENCOUNTER — Ambulatory Visit: Payer: BLUE CROSS/BLUE SHIELD | Admitting: Gastroenterology

## 2019-01-03 ENCOUNTER — Encounter: Payer: Self-pay | Admitting: Gastroenterology

## 2019-01-03 ENCOUNTER — Ambulatory Visit: Payer: BLUE CROSS/BLUE SHIELD | Admitting: Gastroenterology

## 2019-01-03 ENCOUNTER — Other Ambulatory Visit (INDEPENDENT_AMBULATORY_CARE_PROVIDER_SITE_OTHER): Payer: BLUE CROSS/BLUE SHIELD

## 2019-01-03 VITALS — BP 112/70 | HR 63 | Ht 66.0 in | Wt 244.4 lb

## 2019-01-03 DIAGNOSIS — R1030 Lower abdominal pain, unspecified: Secondary | ICD-10-CM | POA: Diagnosis not present

## 2019-01-03 DIAGNOSIS — K5900 Constipation, unspecified: Secondary | ICD-10-CM

## 2019-01-03 DIAGNOSIS — K921 Melena: Secondary | ICD-10-CM | POA: Diagnosis not present

## 2019-01-03 DIAGNOSIS — N950 Postmenopausal bleeding: Secondary | ICD-10-CM

## 2019-01-03 DIAGNOSIS — D649 Anemia, unspecified: Secondary | ICD-10-CM | POA: Diagnosis not present

## 2019-01-03 LAB — VITAMIN D 25 HYDROXY (VIT D DEFICIENCY, FRACTURES): VITD: 12.35 ng/mL — ABNORMAL LOW (ref 30.00–100.00)

## 2019-01-03 LAB — B12 AND FOLATE PANEL
Folate: 6.7 ng/mL (ref >5.9)
Vitamin B-12: 125 pg/mL — ABNORMAL LOW (ref 211–911)

## 2019-01-03 NOTE — Progress Notes (Signed)
Chief Complaint: anemia, constipation, abdominal pain   Referring Provider:     [place], Terry G, MD   HPI:     Terry Ferguson is a 43 y.o. female referred to the [facility] for evaluation of anemia and constipation and abdominal pain.  Was seen by [facility] on 11/20/2018 with plan for MiraLAX, bisacodyl, increase dietary fiber, and GI referral.  Today, she states she has had intermittent hemotachezia/BRBPR which she thought was 2/2 hemorrhoids and/or anal fissures. Has 3-4 BM/month for as long as she can remember. Now with abdominal discomfort described as generalized dull ache, lower > upper. Occurs intermittently; exacerbated by dairy (which she avoids). Otherwise no exacerbating or alleviating factors. Pain has been present for a few years, but more pronounced over the last year. Occasional nausea without emesis. Does have rare episodes of black stools. Eats a lot of fast food. Does not take any of the stool softeners, laxatives, fiber supplements, etc as directed by PCM.   Constipation not actually any worse than baseline, just now with associated abdominal pain and bloating. Does have intermittent diarrhea now, which is new which she thinks is due to increase in fast food/dietary indiscretions.   History of heavy menses which improved with Nuvaring in 2018.  However, now will have heavy bleeding intermittently despite Nuvaring in place.  PCM recommended referral to GYN.patient has not scheduled this  (previously followed with Dr. Estanislado Ferguson at [facility]).   Recent labs notable for H/H 10.9/34.1 with MCV/RDW 82.1/13.6.  Only comparison CBC was from 2011 at which time she was anemic at 9.5/20.1.  Normal BMP.  No iron panel done.  No recent abdominal imaging for review.   Past Medical History:  Diagnosis Date  . Anemia   . Asthma   . Bell's palsy 03-2013  . Bell's palsy   . Depression   . Frequent headaches   . History of chicken pox   .  Migraines   . Vitamin D deficiency      Past Surgical History:  Procedure Laterality Date  . CESAREAN SECTION  2011  . COLONOSCOPY  10 years ago   in [place] exam. Before 2010   Family History  Problem Relation Age of Onset  . Migraines Mother   . Arthritis Mother   . Depression Mother   . Drug abuse Mother   . Migraines Maternal Grandmother   . Arthritis Maternal Grandmother   . COPD Maternal Grandmother   . Depression Maternal Grandmother   . Hearing loss Maternal Grandmother   . Colon polyps Maternal Grandmother   . Depression Father   . Depression Sister   . Asthma Sister   . Early death Sister   . Asthma Daughter   . Asthma Son   . Birth defects Son   . Arthritis Maternal Grandfather   . Heart disease Maternal Grandfather   . Arthritis Paternal Grandmother   . Depression Sister   . Colon cancer Neg Hx   . Esophageal cancer Neg Hx   . Rectal cancer Neg Hx   . Stomach cancer Neg Hx    Social History   Tobacco Use  . Smoking status: Never Smoker  . Smokeless tobacco: Never Used  Substance Use Topics  . Alcohol use: Yes    Comment: socially  . Drug use: No   Current Outpatient Medications  Medication Sig Dispense Refill  . Aspirin-Acetaminophen-Caffeine (EXCEDRIN MIGRAINE PO) Take by mouth  as needed.    Marland Kitchen. buPROPion (WELLBUTRIN XL) 150 MG 24 hr tablet Take 1 tablet (150 mg total) by mouth daily. 30 tablet 1  . etonogestrel-ethinyl estradiol (NUVARING) 0.12-0.015 MG/24HR vaginal ring Place 1 each vaginally every 28 (twenty-eight) days. Insert vaginally and leave in place for 3 consecutive weeks, then remove for 1 week.    . Probiotic Product (PROBIOTIC DAILY PO) Take by mouth as needed.     . citalopram (CELEXA) 10 MG tablet Take 1 tablet (10 mg total) by mouth daily. (Patient not taking: Reported on 12/19/2018) 30 tablet 2  . ferrous sulfate 325 (65 FE) MG tablet Take 1 tablet (325 mg total) by mouth daily. (Patient not taking: Reported on  12/19/2018) 90 tablet 3  . Na Sulfate-K Sulfate-Mg Sulf 17.5-3.13-1.6 GM/177ML SOLN Suprep (no substitutions)-TAKE AS DIRECTED. (Patient not taking: Reported on 01/03/2019) 354 mL 0   No current facility-administered medications for this visit.    Allergies  Allergen Reactions  . Erythromycin Hives  . Erythromycin Base      Review of Systems: All systems reviewed and negative except where noted in HPI.     Physical Exam:    Wt Readings from Last 3 Encounters:  01/03/19 244 lb 6 oz (110.8 kg)  12/19/18 241 lb 12.8 oz (109.7 kg)  11/20/18 239 lb 2 oz (108.5 kg)    BP 112/70   Pulse 63   Ht 5\' 6"  (1.676 m)   Wt 244 lb 6 oz (110.8 kg)   BMI 39.44 kg/m  Constitutional:  Pleasant, in no acute distress. Psychiatric: Normal mood and affect. Behavior is normal. EENT: Pupils normal.  Conjunctivae are normal. No scleral icterus. Neck supple. No cervical LAD. Cardiovascular: Normal rate, regular rhythm. No edema Pulmonary/chest: Effort normal and breath sounds normal. No wheezing, rales or rhonchi. Abdominal: Soft, nondistended, nontender. Bowel sounds active throughout. There are no masses palpable. No hepatomegaly. Neurological: Alert and oriented to person place and time. Skin: Skin is warm and dry. No rashes noted. Rectal: Exam deferred by patient to time of colonoscopy.    ASSESSMENT AND PLAN;   Terry Ferguson is a 43 y.o. female presenting with  1) Anemia: - Check iron panel along with B12 and folate - Check Vit D for co-malabsorption - If IDA, will plan for EGD with biopsies - Further treatment of anemia pending lab results and endoscopic findings  2) Constipation: Longstanding history of chronic constipation, but now with abdominal discomfort, bloating.  Also with episodic diarrhea that seems like overflow diarrhea.  Was previously offered fiber, stool softeners, laxatives which she did not do.  I discussed medical management of chronic constipation  with her again today to include these options as well as newer medications (i.e. trulance, amities a, etc.), which she again declined. - We will continue to encourage increase dietary fiber - Encourage increased fluid intake -Encouraged dietary modifications with healthier eating -We will continue to review and discuss treatment options -Evaluate for luminal/mucosal etiology at time of colonoscopy  3) Hematochezia: Clinical symptoms seem most consistent with anal rectal etiology, but given now associated abdominal pain, increasing symptoms, patient concerns and anemia, certainly warrants endoscopic evaluation. - Colonoscopy  4) Melena: Endorses intermittent black stools in the setting of anemia.  Discussed EGD to eval for UGI etiology of bleeding and similarly if IDA on above labs, also warrants EGD with biopsies.  She is amenable to EGD at the time of colonoscopy. -EGD   5) Postmenopausal bleeding: -Strongly encouraged her  to follow-up with her GYN which she understands.  6) Lower abdominal pain: Suspect secondary to the above constipation.  Offered treatment as above.  Otherwise can evaluate with colonoscopy for mucosal etiology.  Alternatively, discussed low threshold for cross-sectional imaging, particularly in light of postmenopausal bleeding.   The indications, risks, and benefits of EGD and colonoscopy were explained to the patient in detail. Risks include but are not limited to bleeding, perforation, adverse reaction to medications, and cardiopulmonary compromise. Sequelae include but are not limited to the possibility of surgery, hositalization, and mortality. The patient verbalized understanding and wished to proceed. All questions answered, referred to scheduler and bowel prep ordered. Further recommendations pending results of the exam.     Shellia CleverlyVito V Cleveland Paiz, DO, FACG  01/03/2019, 10:02 AM   [place], Terry G, MD

## 2019-01-03 NOTE — Patient Instructions (Signed)
If you are age 41 or older, your body mass index should be between 23-30. Your Body mass index is 39.44 kg/m. If this is out of the aforementioned range listed, please consider follow up with your Primary Care Provider.  If you are age 44 or younger, your body mass index should be between 19-25. Your Body mass index is 39.44 kg/m. If this is out of the aformentioned range listed, please consider follow up with your Primary Care Provider.   You have been scheduled for an endoscopy and colonoscopy. Please follow the written instructions given to you at your visit today. Please pick up your prep supplies at the pharmacy within the next 1-3 days. If you use inhalers (even only as needed), please bring them with you on the day of your procedure. Your physician has requested that you go to www.startemmi.com and enter the access code given to you at your visit today. This web site gives a general overview about your procedure. However, you should still follow specific instructions given to you by our office regarding your preparation for the procedure.  Please go to the lab on the 2nd floor [address] before you leave the office today.   It was a pleasure to see you today!  Vito Cirigliano, D.O.

## 2019-01-04 LAB — IRON,TIBC AND FERRITIN PANEL
%SAT: 23 % (calc) (ref 16–45)
Ferritin: 66 ng/mL (ref 16–154)
Iron: 75 ug/dL (ref 40–190)
TIBC: 329 mcg/dL (calc) (ref 250–450)

## 2019-01-09 ENCOUNTER — Other Ambulatory Visit: Payer: Self-pay

## 2019-01-09 DIAGNOSIS — D649 Anemia, unspecified: Secondary | ICD-10-CM

## 2019-01-09 DIAGNOSIS — E538 Deficiency of other specified B group vitamins: Secondary | ICD-10-CM

## 2019-01-09 DIAGNOSIS — E559 Vitamin D deficiency, unspecified: Secondary | ICD-10-CM

## 2019-01-09 MED ORDER — VITAMIN D (ERGOCALCIFEROL) 1.25 MG (50000 UNIT) PO CAPS: 50000.0000 [IU] | ORAL_CAPSULE | ORAL | 0 refills | Status: DC

## 2019-01-09 MED ORDER — VITAMIN B-12 1000 MCG PO TABS: 1000.0000 ug | ORAL_TABLET | Freq: Every day | ORAL | 1 refills | Status: DC

## 2019-01-09 MED ORDER — FOLIC ACID 1 MG PO TABS: 1.0000 mg | ORAL_TABLET | Freq: Every day | ORAL | 1 refills | Status: DC

## 2019-01-10 ENCOUNTER — Encounter: Payer: Self-pay | Admitting: Gastroenterology

## 2019-01-16 ENCOUNTER — Encounter: Payer: Self-pay | Admitting: Family Medicine

## 2019-01-20 ENCOUNTER — Other Ambulatory Visit: Payer: Self-pay | Admitting: *Deleted

## 2019-01-20 ENCOUNTER — Other Ambulatory Visit (INDEPENDENT_AMBULATORY_CARE_PROVIDER_SITE_OTHER): Payer: BLUE CROSS/BLUE SHIELD

## 2019-01-20 LAB — LIPID PANEL
Cholesterol: 176 mg/dL (ref 0–200)
HDL: 39.8 mg/dL (ref >39.00)
LDL CALC: 115 mg/dL — AB (ref 0–99)
NonHDL: 136.62
Total CHOL/HDL Ratio: 4
Triglycerides: 108 mg/dL (ref 0.0–149.0)
VLDL: 21.6 mg/dL (ref 0.0–40.0)

## 2019-01-22 ENCOUNTER — Encounter: Payer: Self-pay | Admitting: Family Medicine

## 2019-01-23 ENCOUNTER — Ambulatory Visit: Payer: BLUE CROSS/BLUE SHIELD | Admitting: Family Medicine

## 2019-01-23 ENCOUNTER — Encounter: Payer: Self-pay | Admitting: Family Medicine

## 2019-01-23 VITALS — BP 102/78 | HR 64 | Temp 98.1°F | Wt 246.0 lb

## 2019-01-23 DIAGNOSIS — R600 Localized edema: Secondary | ICD-10-CM | POA: Diagnosis not present

## 2019-01-23 DIAGNOSIS — T7840XA Allergy, unspecified, initial encounter: Secondary | ICD-10-CM | POA: Diagnosis not present

## 2019-01-23 MED ORDER — CETIRIZINE HCL 10 MG PO TABS: 10.0000 mg | ORAL_TABLET | Freq: Every day | ORAL | 0 refills | Status: DC

## 2019-01-23 NOTE — Progress Notes (Signed)
Subjective:    Patient ID: Terry Ferguson, female    DOB: July 13, 1976, 43 y.o.   MRN: [id number]  No chief complaint on file.   HPI Patient was seen today for acute concern.  Pt states she woke up at 2am with facial pruritis.  She went to the bathroom to wash her face and noticed eye swelling.  Pt denies SOB, throat itching, difficulty swallowing, swelling or itching else where on body.  Pt denies any changes in meds, soaps, lotions, or detergents. Pt does not wear make-up nor have any pets and denies recent travel.  Pt ate a salad last night from [employer], had [employer] for lunch, and [employer] for breakfast.  2 wks prior to this episode pt had a similar episode of facial edema after eating at [facility].  Pt has not taken anything for her symptoms.  Past Medical History:  Diagnosis Date  . Anemia   . Anxiety   . Asthma   . Bell's palsy 03-2013  . Depression   . Frequent headaches   . History of chicken pox   . Migraines   . Obesity   . Vitamin D deficiency     Allergies  Allergen Reactions  . Erythromycin Hives  . Erythromycin Base     ROS General: Denies fever, chills, night sweats, changes in weight, changes in appetite HEENT: Denies headaches, ear pain, changes in vision, rhinorrhea, sore throat CV: Denies CP, palpitations, SOB, orthopnea Pulm: Denies SOB, cough, wheezing GI: Denies abdominal pain, nausea, vomiting, diarrhea, constipation GU: Denies dysuria, hematuria, frequency, vaginal discharge Msk: Denies muscle cramps, joint pains Neuro: Denies weakness, numbness, tingling Skin: Denies rashes, bruising  +facial edema and pruritis Psych: Denies depression, anxiety, hallucinations    Objective:    Blood pressure 102/78, pulse 64, temperature 98.1 F (36.7 C), temperature source Oral, weight 246 lb (111.6 kg), SpO2 98 %.   Gen. Pleasant, well-nourished, in no distress, normal affect  HEENT: [place]/AT, face symmetric, facial edema, conjunctiva  clear, no scleral icterus, PERRLA, EOMI, nares patent without drainage, pharynx without erythema or exudate. Lungs: no accessory muscle use, normal WOB, CTAB, no wheezes or rales Cardiovascular: RRR, no m/r/g, no peripheral edema Skin:  Warm, dry, intact.  Periorbital edema with erythema and fine papules on upper eyelids.  No other lesions on body      Wt Readings from Last 3 Encounters:  01/03/19 244 lb 6 oz (110.8 kg)  12/19/18 241 lb 12.8 oz (109.7 kg)  11/20/18 239 lb 2 oz (108.5 kg)    Lab Results  Component Value Date   WBC 6.7 10/09/2018   HGB 10.9 (L) 10/09/2018   HCT 34.1 (L) 10/09/2018   PLT 276.0 10/09/2018   GLUCOSE 80 10/09/2018   CHOL 176 01/20/2019   TRIG 108.0 01/20/2019   HDL 39.80 01/20/2019   LDLCALC 115 (H) 01/20/2019   NA 138 10/09/2018   K 4.0 10/09/2018   CL 107 10/09/2018   CREATININE 0.88 10/09/2018   BUN 10 10/09/2018   CO2 23 10/09/2018   TSH 1.81 10/09/2018    Assessment/Plan:  Facial edema -Discussed using ice to help reduce swelling -Also discussed antihistamine.  Did not give patient Benadryl as she is going to work. -Patient given cetirizine in clinic. - Plan: cetirizine (ZYRTEC) 10 MG tablet  Allergic reaction, initial encounter  -Discussed keeping a food diary to see if patient notices certain foods give her problems more than others -Given handout -Patient given RTC or  ED precautions - Plan: cetirizine (ZYRTEC) 10 MG tablet -Follow-up with PCP in 1 week, sooner if needed  Abbe Amsterdam, MD

## 2019-01-23 NOTE — Patient Instructions (Signed)

## 2019-01-24 ENCOUNTER — Encounter: Payer: BLUE CROSS/BLUE SHIELD | Admitting: Gastroenterology

## 2019-01-24 DIAGNOSIS — T7840XA Allergy, unspecified, initial encounter: Secondary | ICD-10-CM | POA: Diagnosis not present

## 2019-01-26 ENCOUNTER — Encounter: Payer: Self-pay | Admitting: Family Medicine

## 2019-02-17 ENCOUNTER — Encounter: Payer: BLUE CROSS/BLUE SHIELD | Admitting: Gastroenterology

## 2019-03-13 ENCOUNTER — Other Ambulatory Visit: Payer: Self-pay

## 2019-03-19 ENCOUNTER — Telehealth: Payer: Self-pay | Admitting: *Deleted

## 2019-03-19 NOTE — Telephone Encounter (Signed)
Left voicemail for patient to let her know that her [facility] would be canceled on April 3 and for her to call with questions or concerns. SM

## 2019-03-20 ENCOUNTER — Telehealth: Payer: Self-pay

## 2019-03-20 NOTE — Telephone Encounter (Signed)
Left message about having to cancel patients procedure on 4/3. I explained that this because of COVID-19 and that we would notify her when we are up and running as normal. Left number for any questions or concerns.

## 2019-03-28 ENCOUNTER — Encounter: Payer: BLUE CROSS/BLUE SHIELD | Admitting: Gastroenterology

## 2019-04-14 ENCOUNTER — Other Ambulatory Visit: Payer: BLUE CROSS/BLUE SHIELD

## 2019-04-29 ENCOUNTER — Telehealth: Payer: Self-pay | Admitting: *Deleted

## 2019-04-29 NOTE — Telephone Encounter (Signed)
Ok to schedule ECL per Dr. Barron Alvine.  LMOM for pt to call back to get this scheduled.

## 2019-04-30 NOTE — Telephone Encounter (Signed)
Spoke with pt and she states she needs a June appointment.  I told her I would call her back when June schedule is available to set up her procedure.  I will sent her new instructions and her prep rx into the pharmacy for her.

## 2019-05-05 NOTE — Telephone Encounter (Signed)
LMOM to call office back- June schedule is open.

## 2019-06-24 ENCOUNTER — Encounter: Payer: Self-pay | Admitting: Gastroenterology

## 2019-08-11 ENCOUNTER — Other Ambulatory Visit: Payer: Self-pay

## 2019-08-11 DIAGNOSIS — Z20822 Contact with and (suspected) exposure to covid-19: Secondary | ICD-10-CM

## 2019-08-13 LAB — NOVEL CORONAVIRUS, NAA: SARS-CoV-2, NAA: NOT DETECTED

## 2019-08-26 DIAGNOSIS — Z1231 Encounter for screening mammogram for malignant neoplasm of breast: Secondary | ICD-10-CM | POA: Diagnosis not present

## 2019-08-26 DIAGNOSIS — Z124 Encounter for screening for malignant neoplasm of cervix: Secondary | ICD-10-CM | POA: Diagnosis not present

## 2019-08-26 DIAGNOSIS — Z01419 Encounter for gynecological examination (general) (routine) without abnormal findings: Secondary | ICD-10-CM | POA: Diagnosis not present

## 2019-08-26 DIAGNOSIS — Z6838 Body mass index (BMI) 38.0-38.9, adult: Secondary | ICD-10-CM | POA: Diagnosis not present

## 2020-03-23 ENCOUNTER — Other Ambulatory Visit: Payer: Self-pay

## 2020-03-23 ENCOUNTER — Encounter: Payer: Self-pay | Admitting: Family Medicine

## 2020-03-23 ENCOUNTER — Ambulatory Visit (INDEPENDENT_AMBULATORY_CARE_PROVIDER_SITE_OTHER): Payer: BC Managed Care – PPO | Admitting: Family Medicine

## 2020-03-23 VITALS — BP 140/76 | HR 66 | Resp 12 | Ht 66.0 in | Wt 242.0 lb

## 2020-03-23 DIAGNOSIS — L853 Xerosis cutis: Secondary | ICD-10-CM

## 2020-03-23 DIAGNOSIS — F331 Major depressive disorder, recurrent, moderate: Secondary | ICD-10-CM | POA: Diagnosis not present

## 2020-03-23 DIAGNOSIS — M2550 Pain in unspecified joint: Secondary | ICD-10-CM

## 2020-03-23 DIAGNOSIS — L219 Seborrheic dermatitis, unspecified: Secondary | ICD-10-CM | POA: Diagnosis not present

## 2020-03-23 DIAGNOSIS — Z23 Encounter for immunization: Secondary | ICD-10-CM | POA: Diagnosis not present

## 2020-03-23 DIAGNOSIS — Z0000 Encounter for general adult medical examination without abnormal findings: Secondary | ICD-10-CM | POA: Diagnosis not present

## 2020-03-23 LAB — SEDIMENTATION RATE: Sed Rate: 44 mm/hr — ABNORMAL HIGH (ref 0–20)

## 2020-03-23 LAB — C-REACTIVE PROTEIN: CRP: 3.7 mg/dL (ref 0.5–20.0)

## 2020-03-23 LAB — TSH: TSH: 2.06 u[IU]/mL (ref 0.35–4.50)

## 2020-03-23 MED ORDER — KETOCONAZOLE 2 % EX CREA: 1.0000 "application " | TOPICAL_CREAM | Freq: Every day | CUTANEOUS | 2 refills | Status: DC

## 2020-03-23 MED ORDER — DESONIDE 0.05 % EX CREA: TOPICAL_CREAM | Freq: Every day | CUTANEOUS | 0 refills | Status: DC | PRN

## 2020-03-23 NOTE — Progress Notes (Signed)
HPI:   [name] is a 44 y.o. female, who is here today for her routine physical.  Last CPE: 2018.  Regular exercise 3 or more time per week: Not consistently. Following a healthy diet: In 2/202021 she decreased intake of fast foods,sodas,sweet tea. Increased vegetables and fruit intake. She lives with he daughter. Her son is in college.  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  Pap smear: 2019, she follows with gyn. Last visit 10/2019.  Immunization History  Administered Date(s) Administered  . Influenza Inj Mdck Quad Pf 10/19/2019  . Tdap 03/23/2020   Mammogram: 10/2019. Colonoscopy: N/A FHx for colon cancer negative. DEXA: N/A  Depression,she discontinued Wellbutrin and Celexa, did not feel well taking the former one and celexa was not helping.  Pruritic rash, intermittent for over a year but it is getting more frequent. C/o dry skin. Benadryl and cold compresses. She has a picture.  Feet and ankle pain and edema. Shooting pain sometimes. Problem has been going on for a year.  Neck pain and shoulder pain. Chiropractor suggested that could be related to breast size.  Letter to continue massage therapy, which has helped with pain and depression.  Review of Systems  Constitutional: Positive for fatigue. Negative for appetite change and fever.  HENT: Negative for dental problem, hearing loss, mouth sores, sore throat and trouble swallowing.   Eyes: Negative for redness and visual disturbance.  Respiratory: Negative for cough, shortness of breath and wheezing.   Cardiovascular: Negative for chest pain and leg swelling.  Gastrointestinal: Negative for abdominal pain, nausea and vomiting.       No changes in bowel habits.  Endocrine: Negative for cold intolerance, heat intolerance, polydipsia, polyphagia and polyuria.  Genitourinary: Negative  for decreased urine volume, dysuria, hematuria, vaginal bleeding and vaginal discharge.  Musculoskeletal: Positive for arthralgias. Negative for gait problem.  Skin: Positive for rash. Negative for color change.  Allergic/Immunologic: Positive for environmental allergies.  Neurological: Negative for syncope, weakness and headaches.  Hematological: Negative for adenopathy. Does not bruise/bleed easily.  Psychiatric/Behavioral: Negative for confusion and sleep disturbance. The patient is nervous/anxious.   All other systems reviewed and are negative.   Current Outpatient Medications on File Prior to Visit  Medication Sig Dispense Refill  . Aspirin-Acetaminophen-Caffeine (EXCEDRIN MIGRAINE PO) Take by mouth as needed.    . cetirizine (ZYRTEC) 10 MG tablet Take 1 tablet (10 mg total) by mouth daily. 30 tablet 0  . etonogestrel-ethinyl estradiol (NUVARING) 0.12-0.015 MG/24HR vaginal ring Place 1 each vaginally every 28 (twenty-eight) days. Insert vaginally and leave in place for 3 consecutive weeks, then remove for 1 week.     No current facility-administered medications on file prior to visit.    Past Medical History:  Diagnosis Date  . Anemia   . Anxiety   . Asthma   . Bell's palsy 03-2013  . Depression   . Frequent headaches   . History of chicken pox   . Migraines   . Obesity   . Vitamin D deficiency     Past Surgical History:  Procedure Laterality Date  . CESAREAN SECTION  2011  . COLONOSCOPY  10 years ago   in [place] exam. Before 2010    Allergies  Allergen Reactions  . Erythromycin Hives  . Erythromycin Base     Family History  Problem Relation Age of Onset  . Migraines Mother   . Arthritis Mother   . Depression Mother   .  Drug abuse Mother   . Migraines Maternal Grandmother   . Arthritis Maternal Grandmother   . COPD Maternal Grandmother   . Depression Maternal Grandmother   . Hearing loss Maternal Grandmother   . Colon polyps Maternal  Grandmother   . Depression Father   . Depression Sister   . Asthma Sister   . Early death Sister   . Asthma Daughter   . Asthma Son   . Birth defects Son   . Arthritis Maternal Grandfather   . Heart disease Maternal Grandfather   . Arthritis Paternal Grandmother   . Depression Sister   . Colon cancer Neg Hx   . Esophageal cancer Neg Hx   . Rectal cancer Neg Hx   . Stomach cancer Neg Hx     Social History   Socioeconomic History  . Marital status: Divorced    Spouse name: Not on file  . Number of children: 2  . Years of education: Not on file  . Highest education level: Not on file  Occupational History  . Not on file  Tobacco Use  . Smoking status: Never Smoker  . Smokeless tobacco: Never Used  Substance and Sexual Activity  . Alcohol use: Yes    Comment: socially  . Drug use: No  . Sexual activity: Not Currently  Other Topics Concern  . Not on file  Social History Narrative  . Not on file   Social Determinants of Health   Financial Resource Strain:   . Difficulty of Paying Living Expenses:   Food Insecurity:   . Worried About Programme researcher, broadcasting/film/video in the Last Year:   . Barista in the Last Year:   Transportation Needs:   . Freight forwarder (Medical):   Marland Kitchen Lack of Transportation (Non-Medical):   Physical Activity:   . Days of Exercise per Week:   . Minutes of Exercise per Session:   Stress:   . Feeling of Stress :   Social Connections:   . Frequency of Communication with Friends and Family:   . Frequency of Social Gatherings with Friends and Family:   . Attends Religious Services:   . Active Member of Clubs or Organizations:   . Attends Banker Meetings:   Marland Kitchen Marital Status:    Vitals:   03/23/20 1042  BP: 140/76  Pulse: 66  Resp: 12  SpO2: 99%   Wt Readings from Last 3 Encounters:  03/23/20 242 lb (109.8 kg)  01/23/19 246 lb (111.6 kg)  01/03/19 244 lb 6 oz (110.8 kg)   Body mass index is 39.06 kg/m.   Physical Exam   Nursing note and vitals reviewed. Constitutional: She is oriented to person, place, and time. She appears well-developed. No distress.  HENT:  Head: Normocephalic and atraumatic.  Right Ear: Hearing, tympanic membrane, external ear and ear canal normal.  Left Ear: Hearing, tympanic membrane, external ear and ear canal normal.  Mouth/Throat: Uvula is midline, oropharynx is clear and moist and mucous membranes are normal.  Eyes: Pupils are equal, round, and reactive to light. Conjunctivae and EOM are normal.  Neck: No tracheal deviation present. No thyromegaly present.  Cardiovascular: Normal rate and regular rhythm.  No murmur heard. Pulses:      Dorsalis pedis pulses are 2+ on the right side and 2+ on the left side.  Respiratory: Effort normal and breath sounds normal. No respiratory distress.  GI: Soft. She exhibits no mass. There is no hepatomegaly. There is no abdominal  tenderness.  Genitourinary:    Genitourinary Comments: Deferred to gyn.   Musculoskeletal:        General: No edema.     Cervical back: Tenderness present.     Comments: No major deformity or signs of synovitis appreciated.  Lymphadenopathy:    She has no cervical adenopathy.       Right: No supraclavicular adenopathy present.       Left: No supraclavicular adenopathy present.  Neurological: She is alert and oriented to person, place, and time. She has normal strength. Gait normal.  Reflex Scores:      Bicep reflexes are 2+ on the right side and 2+ on the left side.      Patellar reflexes are 2+ on the right side and 2+ on the left side. Left-sided facial palsy, rest of CN grossly intact.  Skin: Skin is warm. No rash noted. No erythema.  Psychiatric: Her mood appears anxious. Cognition and memory are normal.  Well groomed, good eye contact.   ASSESSMENT AND PLAN:  Ms. Tannah Dreyfuss was here today annual physical examination.  Orders Placed This Encounter  Procedures  . Tdap vaccine  greater than or equal to 7yo IM  . ANA  . C-reactive protein  . Sedimentation rate  . TSH  . Anti-nuclear ab-titer (ANA titer)   Lab Results  Component Value Date   TSH 2.06 03/23/2020   Lab Results  Component Value Date   ESRSEDRATE 44 (H) 03/23/2020   Lab Results  Component Value Date   CRP 3.7 03/23/2020   Routine general medical examination at a health care facility We discussed the importance of regular physical activity and healthy diet for prevention of chronic illness and/or complications. Preventive guidelines reviewed. Vaccination updated. She had blood work recently through her employer: FLP and FG, all in normal range. Continue following with gyn for her female preventive care. Next CPE in a year.  Seborrheic dermatitis Educated about dx,prognosis,and treatment options. If problem does not improve dermatology evaluation will be recommended.  -     ketoconazole (NIZORAL) 2 % cream; Apply 1 application topically daily. -     desonide (DESOWEN) 0.05 % cream; Apply topically daily as needed.  Arthralgia, unspecified joint ?OA. Other possible etiologies discussed, ? Lupus/RA/connective tissue disease. Further recommendations according with lab results  Dry skin Eucerin or Cetaphil as needed. Warm luke water for showers.  Need for Tdap vaccination -     Tdap vaccine greater than or equal to 7yo IM  Depression, major, recurrent, moderate (HCC) She does not think medication is needed at this time.  Severe obesity (BMI 35.0-39.9) with comorbidity (HCC) She lost about 4 Lb sine her last visit. We discussed benefits of wt loss as well as adverse effects of obesity. Consistency with healthy diet and physical activity recommended.   Return in 1 year (on 03/23/2021).    Jossie Smoot G. [place], MD  [facility]. [place] office.    Cetaphil or eucerin or vaseline on skin as needed. Avoid hot showers. Facial skin seems to be seborrheic dermatitis.If  not better we will [facility] dermatology referral.  At least 150 minutes of moderate exercise per week, daily brisk walking for 15-30 min is a good exercise option. Healthy diet low in saturated (animal) fats and sweets and consisting of fresh fruits and vegetables, lean meats such as fish and white chicken and whole grains.  These are some of recommendations for screening depending of age and risk factors:  - Vaccines:  Tdap vaccine every 10 years.  Shingles vaccine recommended at age 26, could be given after 42 years of age but not sure about insurance coverage.   Pneumonia vaccines: Pneumovax at 36. Sometimes Pneumovax is giving earlier if history of smoking, lung disease,diabetes,kidney disease among some.  Screening for diabetes at age 48 and every 3 years.  Cervical cancer prevention:  Pap smear starts at 42 years of age and continues periodically until 42 years old in low risk women. Pap smear every 3 years between 67 and 70 years old. Pap smear every 3-5 years between women 76 and older if pap smear negative and HPV screening negative.   -Breast cancer: Mammogram: There is disagreement between experts about when to start screening in low risk asymptomatic female but recent recommendations are to start screening at 45 and not later than 42 years old , every 1-2 years and after 42 yo q 2 years. Screening is recommended until 42 years old but some women can continue screening depending of healthy issues.  Colon cancer screening: starts at 42 years old until 42 years old.  Cholesterol disorder screening at age 41 and every 3 years.  Also recommended:  1. Dental visit- Brush and floss your teeth twice daily; visit your dentist twice a year. 2. Eye doctor- Get an eye exam at least every 2 years. 3. Helmet use- Always wear a helmet when riding a bicycle, motorcycle, rollerblading or skateboarding. 4. Safe sex- If you may be exposed to sexually transmitted infections, use a  condom. 5. Seat belts- Seat belts can save your live; always wear one. 6. Smoke/Carbon Monoxide detectors- These detectors need to be installed on the appropriate level of your home. Replace batteries at least once a year. 7. Skin cancer- When out in the sun please cover up and use sunscreen 15 SPF or higher. 8. Violence- If anyone is threatening or hurting you, please tell your healthcare provider.  9. Drink alcohol in moderation- Limit alcohol intake to one drink or less per day. Never drink and drive.

## 2020-03-23 NOTE — Patient Instructions (Signed)
Today you have you routine preventive visit. A few things to remember from today's visit:   Routine general medical examination at a health care facility  Seborrheic dermatitis - Plan: ketoconazole (NIZORAL) 2 % cream, desonide (DESOWEN) 0.05 % cream  Arthralgia, unspecified joint - Plan: ANA, C-reactive protein, Sedimentation rate, TSH  Dry skin - Plan: TSH  Cetaphil or eucerin or vaseline on skin as needed. Avoid hot showers. Facial skin seems to be seborrheic dermatitis.If not better we will [facility] dermatology referral.  At least 150 minutes of moderate exercise per week, daily brisk walking for 15-30 min is a good exercise option. Healthy diet low in saturated (animal) fats and sweets and consisting of fresh fruits and vegetables, lean meats such as fish and white chicken and whole grains.  These are some of recommendations for screening depending of age and risk factors:  - Vaccines:  Tdap vaccine every 10 years.  Shingles vaccine recommended at age 59, could be given after 42 years of age but not sure about insurance coverage.   Pneumonia vaccines: Pneumovax at 65. Sometimes Pneumovax is giving earlier if history of smoking, lung disease,diabetes,kidney disease among some.  Screening for diabetes at age 96 and every 3 years.  Cervical cancer prevention:  Pap smear starts at 42 years of age and continues periodically until 42 years old in low risk women. Pap smear every 3 years between 22 and 7 years old. Pap smear every 3-5 years between women 30 and older if pap smear negative and HPV screening negative.   -Breast cancer: Mammogram: There is disagreement between experts about when to start screening in low risk asymptomatic female but recent recommendations are to start screening at 70 and not later than 42 years old , every 1-2 years and after 42 yo q 2 years. Screening is recommended until 42 years old but some women can continue screening depending of healthy  issues.  Colon cancer screening: starts at 42 years old until 42 years old.  Cholesterol disorder screening at age 16 and every 3 years.  Also recommended:  1. Dental visit- Brush and floss your teeth twice daily; visit your dentist twice a year. 2. Eye doctor- Get an eye exam at least every 2 years. 3. Helmet use- Always wear a helmet when riding a bicycle, motorcycle, rollerblading or skateboarding. 4. Safe sex- If you may be exposed to sexually transmitted infections, use a condom. 5. Seat belts- Seat belts can save your live; always wear one. 6. Smoke/Carbon Monoxide detectors- These detectors need to be installed on the appropriate level of your home. Replace batteries at least once a year. 7. Skin cancer- When out in the sun please cover up and use sunscreen 15 SPF or higher. 8. Violence- If anyone is threatening or hurting you, please tell your healthcare provider.  9. Drink alcohol in moderation- Limit alcohol intake to one drink or less per day. Never drink and drive.

## 2020-03-25 ENCOUNTER — Encounter: Payer: Self-pay | Admitting: Family Medicine

## 2020-03-25 LAB — ANA: Anti Nuclear Antibody (ANA): POSITIVE — AB

## 2020-03-25 LAB — ANTI-NUCLEAR AB-TITER (ANA TITER): ANA Titer 1: 1:40 {titer} — ABNORMAL HIGH

## 2020-03-29 ENCOUNTER — Telehealth: Payer: Self-pay | Admitting: Family Medicine

## 2020-03-29 NOTE — Telephone Encounter (Signed)
Pt would like a phone call to follow up on her lab results. She would like more clarity on the positive result for lupus.

## 2020-03-30 NOTE — Telephone Encounter (Signed)
Message Routed to PCP for review. Labs have not been viewed by PCP.

## 2020-03-31 ENCOUNTER — Other Ambulatory Visit: Payer: Self-pay | Admitting: *Deleted

## 2020-03-31 DIAGNOSIS — R700 Elevated erythrocyte sedimentation rate: Secondary | ICD-10-CM

## 2020-03-31 NOTE — Telephone Encounter (Signed)
On 03/25/2020 I sent her a message with lab results and recommendations. Thanks, BJ

## 2020-03-31 NOTE — Telephone Encounter (Signed)
Returned call to patient and read result note that was sent to ger via my chart from Dr. [place]. Patient verbalized understanding. Referral placed for rheumatology.

## 2020-03-31 NOTE — Telephone Encounter (Signed)
Left message for patient to return call to office for lab results.

## 2020-04-01 ENCOUNTER — Encounter: Payer: Self-pay | Admitting: Family Medicine

## 2020-04-22 DIAGNOSIS — R768 Other specified abnormal immunological findings in serum: Secondary | ICD-10-CM | POA: Diagnosis not present

## 2020-07-07 DIAGNOSIS — U071 COVID-19: Secondary | ICD-10-CM | POA: Diagnosis not present

## 2020-07-07 DIAGNOSIS — R05 Cough: Secondary | ICD-10-CM | POA: Diagnosis not present

## 2020-07-07 DIAGNOSIS — Z20822 Contact with and (suspected) exposure to covid-19: Secondary | ICD-10-CM | POA: Diagnosis not present

## 2020-09-10 DIAGNOSIS — Z3040 Encounter for surveillance of contraceptives, unspecified: Secondary | ICD-10-CM | POA: Diagnosis not present

## 2020-09-10 DIAGNOSIS — Z8371 Family history of colonic polyps: Secondary | ICD-10-CM | POA: Diagnosis not present

## 2020-09-10 DIAGNOSIS — Z01419 Encounter for gynecological examination (general) (routine) without abnormal findings: Secondary | ICD-10-CM | POA: Diagnosis not present

## 2020-09-10 DIAGNOSIS — R21 Rash and other nonspecific skin eruption: Secondary | ICD-10-CM | POA: Diagnosis not present

## 2020-09-10 DIAGNOSIS — Z6838 Body mass index (BMI) 38.0-38.9, adult: Secondary | ICD-10-CM | POA: Diagnosis not present

## 2020-09-10 DIAGNOSIS — Z1231 Encounter for screening mammogram for malignant neoplasm of breast: Secondary | ICD-10-CM | POA: Diagnosis not present

## 2020-09-10 DIAGNOSIS — M3500 Sicca syndrome, unspecified: Secondary | ICD-10-CM | POA: Diagnosis not present

## 2020-09-15 DIAGNOSIS — L718 Other rosacea: Secondary | ICD-10-CM | POA: Diagnosis not present

## 2020-09-15 DIAGNOSIS — L218 Other seborrheic dermatitis: Secondary | ICD-10-CM | POA: Diagnosis not present

## 2020-09-21 DIAGNOSIS — Z1211 Encounter for screening for malignant neoplasm of colon: Secondary | ICD-10-CM | POA: Diagnosis not present

## 2020-09-21 DIAGNOSIS — K625 Hemorrhage of anus and rectum: Secondary | ICD-10-CM | POA: Diagnosis not present

## 2020-09-21 DIAGNOSIS — R1310 Dysphagia, unspecified: Secondary | ICD-10-CM | POA: Diagnosis not present

## 2020-09-21 DIAGNOSIS — K5904 Chronic idiopathic constipation: Secondary | ICD-10-CM | POA: Diagnosis not present

## 2021-01-31 NOTE — Progress Notes (Deleted)
Office Visit Note  Patient: Terry Ferguson             Date of Birth: 1975/12/26           MRN: [id number]             PCP: [place], Betty G, MD Referring: Silverio Lay, MD Visit Date: 02/14/2021 Occupation: @GUAROCC @  Subjective:  No chief complaint on file.   History of Present Illness: Oris Durinda Buzzelli is a 45 y.o. female ***   Activities of Daily Living:  Patient reports morning stiffness for *** {minute/hour:19697}.   Patient {ACTIONS;DENIES/REPORTS:21021675::"Denies"} nocturnal pain.  Difficulty dressing/grooming: {ACTIONS;DENIES/REPORTS:21021675::"Denies"} Difficulty climbing stairs: {ACTIONS;DENIES/REPORTS:21021675::"Denies"} Difficulty getting out of chair: {ACTIONS;DENIES/REPORTS:21021675::"Denies"} Difficulty using hands for taps, buttons, cutlery, and/or writing: {ACTIONS;DENIES/REPORTS:21021675::"Denies"}  No Rheumatology ROS completed.   PMFS History:  Patient Active Problem List   Diagnosis Date Noted  . Depression, major, recurrent, moderate (HCC) 10/09/2018  . Bilateral leg cramps 10/09/2018  . Severe obesity (BMI 35.0-39.9) with comorbidity (HCC) 10/09/2018  . Headache, unspecified headache type 10/09/2018  . Constipation 10/09/2018  . Iron deficiency anemia 10/09/2018    Past Medical History:  Diagnosis Date  . Anemia   . Anxiety   . Asthma   . Bell's palsy 03-2013  . Depression   . Frequent headaches   . History of chicken pox   . Migraines   . Obesity   . Vitamin D deficiency     Family History  Problem Relation Age of Onset  . Migraines Mother   . Arthritis Mother   . Depression Mother   . Drug abuse Mother   . Migraines Maternal Grandmother   . Arthritis Maternal Grandmother   . COPD Maternal Grandmother   . Depression Maternal Grandmother   . Hearing loss Maternal Grandmother   . Colon polyps Maternal Grandmother   . Depression Father   . Depression Sister   . Asthma Sister   . Early death  Sister   . Asthma Daughter   . Asthma Son   . Birth defects Son   . Arthritis Maternal Grandfather   . Heart disease Maternal Grandfather   . Arthritis Paternal Grandmother   . Depression Sister   . Colon cancer Neg Hx   . Esophageal cancer Neg Hx   . Rectal cancer Neg Hx   . Stomach cancer Neg Hx    Past Surgical History:  Procedure Laterality Date  . CESAREAN SECTION  2011  . COLONOSCOPY  10 years ago   in [place] exam. Before 2010   Social History   Social History Narrative  . Not on file   Immunization History  Administered Date(s) Administered  . Influenza Inj Mdck Quad Pf 10/19/2019  . Tdap 03/23/2020     Objective: Vital Signs: There were no vitals taken for this visit.   Physical Exam   Musculoskeletal Exam: ***  CDAI Exam: CDAI Score: -- Patient Global: --; Provider Global: -- Swollen: --; Tender: -- Joint Exam 02/14/2021   No joint exam has been documented for this visit   There is currently no information documented on the homunculus. Go to the Rheumatology activity and complete the homunculus joint exam.  Investigation: No additional findings.  Imaging: No results found.  Recent Labs: Lab Results  Component Value Date   WBC 6.7 10/09/2018   HGB 10.9 (L) 10/09/2018   PLT 276.0 10/09/2018   NA 138 10/09/2018   K 4.0 10/09/2018   CL 107 10/09/2018   CO2  23 10/09/2018   GLUCOSE 80 10/09/2018   BUN 10 10/09/2018   CREATININE 0.88 10/09/2018   CALCIUM 9.3 10/09/2018    Speciality Comments: No specialty comments available.  Procedures:  No procedures performed Allergies: Erythromycin and Erythromycin base   Assessment / Plan:     Visit Diagnoses: Sicca syndrome (HCC)  Bilateral leg cramps  Other iron deficiency anemia  Severe obesity (BMI 35.0-39.9) with comorbidity (HCC)  Depression, major, recurrent, moderate (HCC)  Orders: No orders of the defined types were placed in this encounter.  No orders of the  defined types were placed in this encounter.   Face-to-face time spent with patient was *** minutes. Greater than 50% of time was spent in counseling and coordination of care.  Follow-Up Instructions: No follow-ups on file.   Gearldine Bienenstock, PA-C  Note - This record has been created using Dragon software.  Chart creation errors have been sought, but may not always  have been located. Such creation errors do not reflect on  the standard of medical care.

## 2021-02-07 ENCOUNTER — Ambulatory Visit: Payer: BC Managed Care – PPO | Admitting: Family Medicine

## 2021-02-14 ENCOUNTER — Ambulatory Visit: Payer: BC Managed Care – PPO | Admitting: Rheumatology

## 2021-02-14 DIAGNOSIS — D508 Other iron deficiency anemias: Secondary | ICD-10-CM

## 2021-02-14 DIAGNOSIS — F331 Major depressive disorder, recurrent, moderate: Secondary | ICD-10-CM

## 2021-02-14 DIAGNOSIS — M3500 Sicca syndrome, unspecified: Secondary | ICD-10-CM

## 2021-02-14 DIAGNOSIS — R252 Cramp and spasm: Secondary | ICD-10-CM

## 2021-03-08 ENCOUNTER — Ambulatory Visit: Payer: BC Managed Care – PPO | Admitting: Rheumatology

## 2021-03-29 NOTE — Progress Notes (Deleted)
Office Visit Note  Patient: Terry Ferguson             Date of Birth: [birth year]           MRN: [id number]             PCP: Martinique, Betty G, MD Referring: Delsa Bern, MD Visit Date: 04/12/2021 Occupation: @GUAROCC @  Subjective:  No chief complaint on file.   History of Present Illness: Terry Ferguson is a 45 y.o. female ***   Activities of Daily Living:  Patient reports morning stiffness for *** {minute/hour:19697}.   Patient {ACTIONS;DENIES/REPORTS:21021675::"Denies"} nocturnal pain.  Difficulty dressing/grooming: {ACTIONS;DENIES/REPORTS:21021675::"Denies"} Difficulty climbing stairs: {ACTIONS;DENIES/REPORTS:21021675::"Denies"} Difficulty getting out of chair: {ACTIONS;DENIES/REPORTS:21021675::"Denies"} Difficulty using hands for taps, buttons, cutlery, and/or writing: {ACTIONS;DENIES/REPORTS:21021675::"Denies"}  No Rheumatology ROS completed.   PMFS History:  Patient Active Problem List   Diagnosis Date Noted  . Depression, major, recurrent, moderate ([place]) 10/09/2018  . Bilateral leg cramps 10/09/2018  . Severe obesity (BMI 35.0-39.9) with comorbidity ([place]) 10/09/2018  . Headache, unspecified headache type 10/09/2018  . Constipation 10/09/2018  . Iron deficiency anemia 10/09/2018    Past Medical History:  Diagnosis Date  . Anemia   . Anxiety   . Asthma   . Bell's palsy 03-2013  . Depression   . Frequent headaches   . History of chicken pox   . Migraines   . Obesity   . Vitamin D deficiency     Family History  Problem Relation Age of Onset  . Migraines Mother   . Arthritis Mother   . Depression Mother   . Drug abuse Mother   . Migraines Maternal Grandmother   . Arthritis Maternal Grandmother   . COPD Maternal Grandmother   . Depression Maternal Grandmother   . Hearing loss Maternal Grandmother   . Colon polyps Maternal Grandmother   . Depression Father   . Depression Sister   . Asthma Sister   . Early death  Sister   . Asthma Daughter   . Asthma Son   . Birth defects Son   . Arthritis Maternal Grandfather   . Heart disease Maternal Grandfather   . Arthritis Paternal Grandmother   . Depression Sister   . Colon cancer Neg Hx   . Esophageal cancer Neg Hx   . Rectal cancer Neg Hx   . Stomach cancer Neg Hx    Past Surgical History:  Procedure Laterality Date  . CESAREAN SECTION  2011  . COLONOSCOPY  10 years ago   in [place] exam. Before 2010   Social History   Social History Narrative  . Not on file   Immunization History  Administered Date(s) Administered  . Influenza Inj Mdck Quad Pf 10/19/2019  . Tdap 03/23/2020     Objective: Vital Signs: There were no vitals taken for this visit.   Physical Exam   Musculoskeletal Exam: ***  CDAI Exam: CDAI Score: -- Patient Global: --; Provider Global: -- Swollen: --; Tender: -- Joint Exam 04/12/2021   No joint exam has been documented for this visit   There is currently no information documented on the homunculus. Go to the Rheumatology activity and complete the homunculus joint exam.  Investigation: No additional findings.  Imaging: No results found.  Recent Labs: Lab Results  Component Value Date   WBC 6.7 10/09/2018   HGB 10.9 (L) 10/09/2018   PLT 276.0 10/09/2018   NA 138 10/09/2018   K 4.0 10/09/2018   CL 107 10/09/2018   CO2  23 10/09/2018   GLUCOSE 80 10/09/2018   BUN 10 10/09/2018   CREATININE 0.88 10/09/2018   CALCIUM 9.3 10/09/2018    Speciality Comments: No specialty comments available.  Procedures:  No procedures performed Allergies: Erythromycin and Erythromycin base   Assessment / Plan:     Visit Diagnoses: Sjogren's syndrome with other organ involvement ([place]) - 03/23/20: ANA 1:40NS, CRP 3.7, ESR 44, TSH 2.06  Elevated sed rate  Other iron deficiency anemia  Bilateral leg cramps  Depression, major, recurrent, moderate (HCC)  Orders: No orders of the defined types were placed  in this encounter.  No orders of the defined types were placed in this encounter.   Face-to-face time spent with patient was *** minutes. Greater than 50% of time was spent in counseling and coordination of care.  Follow-Up Instructions: No follow-ups on file.   Ofilia Neas, PA-C  Note - This record has been created using Dragon software.  Chart creation errors have been sought, but may not always  have been located. Such creation errors do not reflect on  the standard of medical care.

## 2021-04-12 ENCOUNTER — Ambulatory Visit: Payer: BC Managed Care – PPO | Admitting: Rheumatology

## 2021-04-12 DIAGNOSIS — D508 Other iron deficiency anemias: Secondary | ICD-10-CM

## 2021-04-12 DIAGNOSIS — M3509 Sicca syndrome with other organ involvement: Secondary | ICD-10-CM

## 2021-04-12 DIAGNOSIS — R252 Cramp and spasm: Secondary | ICD-10-CM

## 2021-04-12 DIAGNOSIS — F331 Major depressive disorder, recurrent, moderate: Secondary | ICD-10-CM

## 2021-04-12 DIAGNOSIS — R700 Elevated erythrocyte sedimentation rate: Secondary | ICD-10-CM

## 2021-05-10 ENCOUNTER — Ambulatory Visit: Payer: BC Managed Care – PPO | Admitting: Rheumatology

## 2021-05-20 NOTE — Progress Notes (Deleted)
Office Visit Note  Patient: Terry Ferguson             Date of Birth: [birth year]           MRN: [id number]             PCP: [place], Betty G, MD Referring: Silverio Lay, MD Visit Date: 06/02/2021 Occupation: @GUAROCC @  Subjective:  No chief complaint on file.   History of Present Illness: Terry Ferguson is a 45 y.o. female ***   Activities of Daily Living:  Patient reports morning stiffness for *** {minute/hour:19697}.   Patient {ACTIONS;DENIES/REPORTS:21021675::"Denies"} nocturnal pain.  Difficulty dressing/grooming: {ACTIONS;DENIES/REPORTS:21021675::"Denies"} Difficulty climbing stairs: {ACTIONS;DENIES/REPORTS:21021675::"Denies"} Difficulty getting out of chair: {ACTIONS;DENIES/REPORTS:21021675::"Denies"} Difficulty using hands for taps, buttons, cutlery, and/or writing: {ACTIONS;DENIES/REPORTS:21021675::"Denies"}  No Rheumatology ROS completed.   PMFS History:  Patient Active Problem List   Diagnosis Date Noted  . Depression, major, recurrent, moderate (HCC) 10/09/2018  . Bilateral leg cramps 10/09/2018  . Severe obesity (BMI 35.0-39.9) with comorbidity (HCC) 10/09/2018  . Headache, unspecified headache type 10/09/2018  . Constipation 10/09/2018  . Iron deficiency anemia 10/09/2018    Past Medical History:  Diagnosis Date  . Anemia   . Anxiety   . Asthma   . Bell's palsy 03-2013  . Depression   . Frequent headaches   . History of chicken pox   . Migraines   . Obesity   . Vitamin D deficiency     Family History  Problem Relation Age of Onset  . Migraines Mother   . Arthritis Mother   . Depression Mother   . Drug abuse Mother   . Migraines Maternal Grandmother   . Arthritis Maternal Grandmother   . COPD Maternal Grandmother   . Depression Maternal Grandmother   . Hearing loss Maternal Grandmother   . Colon polyps Maternal Grandmother   . Depression Father   . Depression Sister   . Asthma Sister   . Early death  Sister   . Asthma Daughter   . Asthma Son   . Birth defects Son   . Arthritis Maternal Grandfather   . Heart disease Maternal Grandfather   . Arthritis Paternal Grandmother   . Depression Sister   . Colon cancer Neg Hx   . Esophageal cancer Neg Hx   . Rectal cancer Neg Hx   . Stomach cancer Neg Hx    Past Surgical History:  Procedure Laterality Date  . CESAREAN SECTION  2011  . COLONOSCOPY  10 years ago   in [place] exam. Before 2010   Social History   Social History Narrative  . Not on file   Immunization History  Administered Date(s) Administered  . Influenza Inj Mdck Quad Pf 10/19/2019  . Tdap 03/23/2020     Objective: Vital Signs: There were no vitals taken for this visit.   Physical Exam   Musculoskeletal Exam: ***  CDAI Exam: CDAI Score: -- Patient Global: --; Provider Global: -- Swollen: --; Tender: -- Joint Exam 06/02/2021   No joint exam has been documented for this visit   There is currently no information documented on the homunculus. Go to the Rheumatology activity and complete the homunculus joint exam.  Investigation: No additional findings.  Imaging: No results found.  Recent Labs: Lab Results  Component Value Date   WBC 6.7 10/09/2018   HGB 10.9 (L) 10/09/2018   PLT 276.0 10/09/2018   NA 138 10/09/2018   K 4.0 10/09/2018   CL 107 10/09/2018   CO2  23 10/09/2018   GLUCOSE 80 10/09/2018   BUN 10 10/09/2018   CREATININE 0.88 10/09/2018   CALCIUM 9.3 10/09/2018    Speciality Comments: No specialty comments available.  Procedures:  No procedures performed Allergies: Erythromycin and Erythromycin base   Assessment / Plan:     Visit Diagnoses: Sjogren's syndrome with other organ involvement (HCC) - Dx at [facility] rheum  Bilateral leg cramps  Other iron deficiency anemia  Depression, major, recurrent, moderate (HCC)  Orders: No orders of the defined types were placed in this encounter.  No orders of the  defined types were placed in this encounter.   Face-to-face time spent with patient was *** minutes. Greater than 50% of time was spent in counseling and coordination of care.  Follow-Up Instructions: No follow-ups on file.   Gearldine Bienenstock, PA-C  Note - This record has been created using Dragon software.  Chart creation errors have been sought, but may not always  have been located. Such creation errors do not reflect on  the standard of medical care.

## 2021-06-02 ENCOUNTER — Ambulatory Visit: Payer: BC Managed Care – PPO | Admitting: Rheumatology

## 2021-06-02 DIAGNOSIS — F331 Major depressive disorder, recurrent, moderate: Secondary | ICD-10-CM

## 2021-06-02 DIAGNOSIS — R252 Cramp and spasm: Secondary | ICD-10-CM

## 2021-06-02 DIAGNOSIS — D508 Other iron deficiency anemias: Secondary | ICD-10-CM

## 2021-06-02 DIAGNOSIS — R768 Other specified abnormal immunological findings in serum: Secondary | ICD-10-CM

## 2021-06-23 ENCOUNTER — Ambulatory Visit: Payer: BC Managed Care – PPO | Admitting: Rheumatology

## 2021-06-24 NOTE — Progress Notes (Deleted)
Office Visit Note  Patient: Terry Ferguson             Date of Birth: [birth year]           MRN: [id number]             PCP: [place], Betty G, MD Referring: Silverio Lay, MD Visit Date: 07/01/2021 Occupation: @GUAROCC @  Subjective:  No chief complaint on file.   History of Present Illness: Terry Ferguson is a 45 y.o. female ***   Activities of Daily Living:  Patient reports morning stiffness for *** {minute/hour:19697}.   Patient {ACTIONS;DENIES/REPORTS:21021675::"Denies"} nocturnal pain.  Difficulty dressing/grooming: {ACTIONS;DENIES/REPORTS:21021675::"Denies"} Difficulty climbing stairs: {ACTIONS;DENIES/REPORTS:21021675::"Denies"} Difficulty getting out of chair: {ACTIONS;DENIES/REPORTS:21021675::"Denies"} Difficulty using hands for taps, buttons, cutlery, and/or writing: {ACTIONS;DENIES/REPORTS:21021675::"Denies"}  No Rheumatology ROS completed.   PMFS History:  Patient Active Problem List   Diagnosis Date Noted   Depression, major, recurrent, moderate (HCC) 10/09/2018   Bilateral leg cramps 10/09/2018   Severe obesity (BMI 35.0-39.9) with comorbidity (HCC) 10/09/2018   Headache, unspecified headache type 10/09/2018   Constipation 10/09/2018   Iron deficiency anemia 10/09/2018    Past Medical History:  Diagnosis Date   Anemia    Anxiety    Asthma    Bell's palsy 03-2013   Depression    Frequent headaches    History of chicken pox    Migraines    Obesity    Vitamin D deficiency     Family History  Problem Relation Age of Onset   Migraines Mother    Arthritis Mother    Depression Mother    Drug abuse Mother    Migraines Maternal Grandmother    Arthritis Maternal Grandmother    COPD Maternal Grandmother    Depression Maternal Grandmother    Hearing loss Maternal Grandmother    Colon polyps Maternal Grandmother    Depression Father    Depression Sister    Asthma Sister    Early death Sister    Asthma Daughter     Asthma Son    Birth defects Son    Arthritis Maternal Grandfather    Heart disease Maternal Grandfather    Arthritis Paternal Grandmother    Depression Sister    Colon cancer Neg Hx    Esophageal cancer Neg Hx    Rectal cancer Neg Hx    Stomach cancer Neg Hx    Past Surgical History:  Procedure Laterality Date   CESAREAN SECTION  2011   COLONOSCOPY  10 years ago   in [place] exam. Before 2010   Social History   Social History Narrative   Not on file   Immunization History  Administered Date(s) Administered   Influenza Inj Mdck Quad Pf 10/19/2019   Tdap 03/23/2020     Objective: Vital Signs: There were no vitals taken for this visit.   Physical Exam   Musculoskeletal Exam: ***  CDAI Exam: CDAI Score: -- Patient Global: --; Provider Global: -- Swollen: --; Tender: -- Joint Exam 07/01/2021   No joint exam has been documented for this visit   There is currently no information documented on the homunculus. Go to the Rheumatology activity and complete the homunculus joint exam.  Investigation: No additional findings.  Imaging: No results found.  Recent Labs: Lab Results  Component Value Date   WBC 6.7 10/09/2018   HGB 10.9 (L) 10/09/2018   PLT 276.0 10/09/2018   NA 138 10/09/2018   K 4.0 10/09/2018   CL 107 10/09/2018   CO2  23 10/09/2018   GLUCOSE 80 10/09/2018   BUN 10 10/09/2018   CREATININE 0.88 10/09/2018   CALCIUM 9.3 10/09/2018    Speciality Comments: No specialty comments available.  Procedures:  No procedures performed Allergies: Erythromycin and Erythromycin base   Assessment / Plan:     Visit Diagnoses: Positive ANA (antinuclear antibody) - evaluated at [place] rheum. 04/27/20: ANA+, dsDNA 1, scl-70-, RNP-, Ro 4.7, La-, Smith-,   Rosacea - Dx by dermatologist  Sicca complex [facility])  History of iron deficiency anemia  Bilateral leg cramps  Depression, major, recurrent, moderate (HCC)  Severe obesity (BMI 35.0-39.9) with  comorbidity (HCC)  Orders: No orders of the defined types were placed in this encounter.  No orders of the defined types were placed in this encounter.   Face-to-face time spent with patient was *** minutes. Greater than 50% of time was spent in counseling and coordination of care.  Follow-Up Instructions: No follow-ups on file.   Gearldine Bienenstock, PA-C  Note - This record has been created using Dragon software.  Chart creation errors have been sought, but may not always  have been located. Such creation errors do not reflect on  the standard of medical care.

## 2021-07-01 ENCOUNTER — Ambulatory Visit: Payer: BC Managed Care – PPO | Admitting: Rheumatology

## 2021-07-22 ENCOUNTER — Telehealth (INDEPENDENT_AMBULATORY_CARE_PROVIDER_SITE_OTHER): Payer: BC Managed Care – PPO | Admitting: Family Medicine

## 2021-07-22 ENCOUNTER — Encounter: Payer: Self-pay | Admitting: Family Medicine

## 2021-07-22 ENCOUNTER — Other Ambulatory Visit: Payer: Self-pay | Admitting: Family Medicine

## 2021-07-22 VITALS — Ht 66.0 in

## 2021-07-22 DIAGNOSIS — U071 COVID-19: Secondary | ICD-10-CM

## 2021-07-22 MED ORDER — MOLNUPIRAVIR EUA 200MG CAPSULE: 4.0000 | ORAL_CAPSULE | Freq: Two times a day (BID) | ORAL | 0 refills | Status: AC

## 2021-07-22 MED ORDER — FLUTICASONE PROPIONATE 50 MCG/ACT NA SUSP: 1.0000 | Freq: Two times a day (BID) | NASAL | 0 refills | Status: DC

## 2021-07-22 NOTE — Progress Notes (Signed)
MyChart Video Visit  Virtual Visit via Video Note   This visit type was conducted due to national recommendations for restrictions regarding the COVID-19 Pandemic (e.g. social distancing) in an effort to limit this patient's exposure and mitigate transmission in our community. This patient is at least at moderate risk for complications without adequate follow up. This format is felt to be most appropriate for this patient at this time. Physical exam was limited by quality of the video and audio technology used for the visit.   Patient location: home Provider location: office  I discussed the limitations of evaluation and management by telemedicine and the availability of in person appointments. The patient expressed understanding and agreed to proceed.  Patient: Terry Ferguson   DOB: [birth year]   45 y.o. Female  MRN: [id number] Visit Date: 07/22/2021  Today's healthcare provider: Gracelee Stemmler [place], MD   Chief Complaint  Patient presents with   Covid Positive   Ms. Terry Ferguson is a 45 yo female with hx of anxiety,depression,obesity,and constipation c/o 2-3 days of respiratory symptoms. She had positive home COVID 19 test x 2 on Tuesday, when she started with symptoms.    URI  This is a new problem. The current episode started in the past 7 days. The problem has been gradually improving. There has been no fever. Associated symptoms include chest pain, congestion, coughing, headaches, nausea, a plugged ear sensation, rhinorrhea and sneezing. Pertinent negatives include no abdominal pain, diarrhea, dysuria, ear pain, joint swelling, neck pain, rash, sore throat, swollen glands, vomiting or wheezing.   Cough is non productive and mild, frequent sneezing. Back pain and body aches x 1 days, improved. Negative for ageusia, she is not sure about anosmia because she cannot breath through her nose. Chest wall pain with sneezing and cough. No associated  wheezing or DOE.  She is concerned about pneumonia and asking if she can do something to prevent complications. She has taken Mucinex cough and cold and today she tried nasal afrin.  Her daughter was sick a week ago, she performed a home test at that time, negative,but now she realizes she did not have a good technique when she collected sample.Evaluated by her pediatrician and started on abx.  COVID 19 vaccination completed. No recent travel.  Patient Active Problem List   Diagnosis Date Noted   Depression, major, recurrent, moderate (HCC) 10/09/2018   Bilateral leg cramps 10/09/2018   Severe obesity (BMI 35.0-39.9) with comorbidity (HCC) 10/09/2018   Headache, unspecified headache type 10/09/2018   Constipation 10/09/2018   Iron deficiency anemia 10/09/2018   Past Medical History:  Diagnosis Date   Anemia    Anxiety    Asthma    Bell's palsy 03-2013   Depression    Frequent headaches    History of chicken pox    Migraines    Obesity    Vitamin D deficiency    Social History   Tobacco Use   Smoking status: Never   Smokeless tobacco: Never  Vaping Use   Vaping Use: Never used  Substance Use Topics   Alcohol use: Yes    Comment: socially   Drug use: No   Allergies  Allergen Reactions   Erythromycin Hives   Erythromycin Base     Medications: Outpatient Medications Prior to Visit  Medication Sig   Aspirin-Acetaminophen-Caffeine (EXCEDRIN MIGRAINE PO) Take by mouth as needed.   cetirizine (ZYRTEC) 10 MG tablet Take 1 tablet (10 mg total) by mouth daily.  desonide (DESOWEN) 0.05 % cream Apply topically daily as needed.   etonogestrel-ethinyl estradiol (NUVARING) 0.12-0.015 MG/24HR vaginal ring Place 1 each vaginally every 28 (twenty-eight) days. Insert vaginally and leave in place for 3 consecutive weeks, then remove for 1 week.   ketoconazole (NIZORAL) 2 % cream Apply 1 application topically daily.   No facility-administered medications prior to visit.    Review of Systems  HENT:  Positive for congestion, rhinorrhea and sneezing. Negative for ear pain and sore throat.   Respiratory:  Positive for cough. Negative for wheezing.   Cardiovascular:  Positive for chest pain.  Gastrointestinal:  Positive for nausea. Negative for abdominal pain, diarrhea and vomiting.  Genitourinary:  Negative for dysuria.  Musculoskeletal:  Negative for neck pain.  Skin:  Negative for rash.  Neurological:  Positive for headaches.  See pertinent positives and negatives per HPI.  Objective    Ht 5\' 6"  (1.676 m)   BMI 39.06 kg/m   GENERAL: alert, oriented, appears well and in no acute distress  HEENT: atraumatic, conjunctiva clear, no obvious abnormalities on inspection of external nose and ears Nasal congestion and sneezing.  NECK: normal movements of the head and neck  LUNGS: on inspection no signs of respiratory distress, breathing rate appears normal, no obvious gross SOB, gasping or wheezing  CV: no obvious cyanosis  MS: moves all visible extremities without noticeable abnormality  PSYCH/NEURO: pleasant and cooperative, no obvious depression, + anxious. Speech and thought processing grossly intact   Assessment & Plan     1. COVID-19 virus infection We discussed dx,possible complications, and treatment options. Mild disease with risk of complications due to obesity. We discussed oral antiviral options and side effects, she would like to start treatment, Molnupiravir recommended. Symptomatic treatment with Tylenol 500 mg 3-4 times per day if needed, plenty of po fluids,and rest. I do not recommend nasal afrin, side effects discussed. Nasal irrigations as needed with saline and Flonase nasal spray daily prn. I do not think imaging is needed today. Monitor temp. 7 days of quarantine recommended, from symptoms onset. Note for work will be issued. Clearly instructed about warning signed.  - molnupiravir EUA 200 mg CAPS; Take 4 capsules (800 mg  total) by mouth 2 (two) times daily for 5 days.  Dispense: 40 capsule; Refill: 0  Return if symptoms worsen or fail to improve.    I discussed the assessment and treatment plan with the patient. Ms. was provided an opportunity to ask questions and all were answered. She agreed with the plan and demonstrated an understanding of the instructions.   The patient was advised to call back or seek an in-person evaluation if the symptoms worsen or if the condition fails to improve as anticipated.  Dusten Ellinwood Octavio Manns, MD [facility] [employer] [phone] (phone) [phone] (fax)  [facility]

## 2021-07-23 ENCOUNTER — Encounter: Payer: Self-pay | Admitting: Family Medicine

## 2021-07-26 ENCOUNTER — Ambulatory Visit: Payer: BC Managed Care – PPO | Admitting: Rheumatology

## 2021-10-04 DIAGNOSIS — Z6838 Body mass index (BMI) 38.0-38.9, adult: Secondary | ICD-10-CM | POA: Diagnosis not present

## 2021-10-04 DIAGNOSIS — Z1231 Encounter for screening mammogram for malignant neoplasm of breast: Secondary | ICD-10-CM | POA: Diagnosis not present

## 2021-10-04 DIAGNOSIS — R102 Pelvic and perineal pain: Secondary | ICD-10-CM | POA: Diagnosis not present

## 2021-10-04 DIAGNOSIS — Z3040 Encounter for surveillance of contraceptives, unspecified: Secondary | ICD-10-CM | POA: Diagnosis not present

## 2021-10-04 DIAGNOSIS — Z01419 Encounter for gynecological examination (general) (routine) without abnormal findings: Secondary | ICD-10-CM | POA: Diagnosis not present

## 2021-10-10 ENCOUNTER — Ambulatory Visit: Payer: BC Managed Care – PPO | Admitting: Family Medicine

## 2021-10-19 ENCOUNTER — Ambulatory Visit: Payer: BC Managed Care – PPO | Admitting: Family Medicine

## 2021-10-26 DIAGNOSIS — K5900 Constipation, unspecified: Secondary | ICD-10-CM | POA: Diagnosis not present

## 2021-10-26 DIAGNOSIS — R102 Pelvic and perineal pain: Secondary | ICD-10-CM | POA: Diagnosis not present

## 2021-10-26 DIAGNOSIS — D259 Leiomyoma of uterus, unspecified: Secondary | ICD-10-CM | POA: Diagnosis not present

## 2021-10-31 ENCOUNTER — Other Ambulatory Visit: Payer: Self-pay

## 2021-11-01 ENCOUNTER — Encounter: Payer: Self-pay | Admitting: Family Medicine

## 2021-11-01 ENCOUNTER — Ambulatory Visit (INDEPENDENT_AMBULATORY_CARE_PROVIDER_SITE_OTHER): Payer: BC Managed Care – PPO | Admitting: Family Medicine

## 2021-11-01 VITALS — BP 128/80 | HR 75 | Temp 98.1°F | Resp 16 | Ht 66.0 in | Wt 255.0 lb

## 2021-11-01 DIAGNOSIS — Z0189 Encounter for other specified special examinations: Secondary | ICD-10-CM | POA: Diagnosis not present

## 2021-11-01 DIAGNOSIS — K5900 Constipation, unspecified: Secondary | ICD-10-CM | POA: Diagnosis not present

## 2021-11-01 DIAGNOSIS — Z130 Encounter for screening for diseases of the blood and blood-forming organs and certain disorders involving the immune mechanism: Secondary | ICD-10-CM | POA: Diagnosis not present

## 2021-11-01 DIAGNOSIS — Z0000 Encounter for general adult medical examination without abnormal findings: Secondary | ICD-10-CM

## 2021-11-01 DIAGNOSIS — Z13228 Encounter for screening for other metabolic disorders: Secondary | ICD-10-CM

## 2021-11-01 DIAGNOSIS — R109 Unspecified abdominal pain: Secondary | ICD-10-CM

## 2021-11-01 DIAGNOSIS — F332 Major depressive disorder, recurrent severe without psychotic features: Secondary | ICD-10-CM | POA: Diagnosis not present

## 2021-11-01 DIAGNOSIS — R5383 Other fatigue: Secondary | ICD-10-CM

## 2021-11-01 DIAGNOSIS — Z1329 Encounter for screening for other suspected endocrine disorder: Secondary | ICD-10-CM | POA: Diagnosis not present

## 2021-11-01 DIAGNOSIS — F411 Generalized anxiety disorder: Secondary | ICD-10-CM | POA: Insufficient documentation

## 2021-11-01 DIAGNOSIS — G4730 Sleep apnea, unspecified: Secondary | ICD-10-CM

## 2021-11-01 DIAGNOSIS — Z13220 Encounter for screening for lipoid disorders: Secondary | ICD-10-CM | POA: Diagnosis not present

## 2021-11-01 LAB — CBC
HCT: 33.8 % — ABNORMAL LOW (ref 36.0–46.0)
Hemoglobin: 10.9 g/dL — ABNORMAL LOW (ref 12.0–15.0)
MCHC: 32.3 g/dL (ref 30.0–36.0)
MCV: 82.9 fl (ref 78.0–100.0)
Platelets: 265 10*3/uL (ref 150.0–400.0)
RBC: 4.08 Mil/uL (ref 3.87–5.11)
RDW: 13.8 % (ref 11.5–15.5)
WBC: 6.6 10*3/uL (ref 4.0–10.5)

## 2021-11-01 LAB — COMPREHENSIVE METABOLIC PANEL
ALT: 11 U/L (ref 0–35)
AST: 7 U/L (ref 0–37)
Albumin: 3.9 g/dL (ref 3.5–5.2)
Alkaline Phosphatase: 55 U/L (ref 39–117)
BUN: 9 mg/dL (ref 6–23)
CO2: 24 mEq/L (ref 19–32)
Calcium: 9.1 mg/dL (ref 8.4–10.5)
Chloride: 107 mEq/L (ref 96–112)
Creatinine, Ser: 0.78 mg/dL (ref 0.40–1.20)
GFR: 93.34 mL/min (ref >60.00)
Glucose, Bld: 77 mg/dL (ref 70–99)
Potassium: 3.8 mEq/L (ref 3.5–5.1)
Sodium: 138 mEq/L (ref 135–145)
Total Bilirubin: 0.3 mg/dL (ref 0.2–1.2)
Total Protein: 7.4 g/dL (ref 6.0–8.3)

## 2021-11-01 LAB — LIPID PANEL
Cholesterol: 189 mg/dL (ref 0–200)
HDL: 39.6 mg/dL (ref >39.00)
LDL Cholesterol: 134 mg/dL — ABNORMAL HIGH (ref 0–99)
NonHDL: 148.98
Total CHOL/HDL Ratio: 5
Triglycerides: 77 mg/dL (ref 0.0–149.0)
VLDL: 15.4 mg/dL (ref 0.0–40.0)

## 2021-11-01 LAB — TSH: TSH: 2.64 u[IU]/mL (ref 0.35–5.50)

## 2021-11-01 LAB — HEMOGLOBIN A1C: Hgb A1c MFr Bld: 5.9 % (ref 4.6–6.5)

## 2021-11-01 MED ORDER — FERROUS SULFATE 325 (65 FE) MG PO TABS: 325.0000 mg | ORAL_TABLET | Freq: Every day | ORAL | 2 refills | Status: AC

## 2021-11-01 MED ORDER — FLUOXETINE HCL 20 MG PO CAPS: 20.0000 mg | ORAL_CAPSULE | Freq: Every day | ORAL | 3 refills | Status: AC

## 2021-11-01 NOTE — Patient Instructions (Addendum)
A few things to remember from today's visit:  Routine general medical examination at a health care facility  Depression, major, recurrent, moderate (HCC)  Severe obesity (BMI 35.0-39.9) with comorbidity ([place]) - Plan: Insulin, Free (Bioactive)  Abdominal pain, unspecified abdominal location  Constipation, unspecified constipation type - Plan: TSH Miralax and Bisacodyl 5 mg at bedtime as needed. Benafiber 2 times daily 1 tsp. Screening for lipoid disorders - Plan: Lipid panel  Screening for endocrine, metabolic and immunity disorder - Plan: Hemoglobin A1c, Comprehensive metabolic panel  Patient request for diagnostic testing - Plan: Insulin, Free (Bioactive), TSH  Fatigue, unspecified type - Plan: TSH, CBC  Sleep apnea, unspecified type - Plan: Ambulatory referral to Sleep Studies  If you need refills please call your pharmacy. Do not use My Chart to request refills or for acute issues that need immediate attention.   1700 kcal daily.  Please be sure medication list is accurate. If a new problem present, please set up appointment sooner than planned today. Preventive Care 69-82 Years Old, Female Preventive care refers to lifestyle choices and visits with your health care provider that can promote health and wellness. Preventive care visits are also called wellness exams. What can I expect for my preventive care visit? Counseling Your health care provider may ask you questions about your: Medical history, including: Past medical problems. Family medical history. Pregnancy history. Current health, including: Menstrual cycle. Method of birth control. Emotional well-being. Home life and relationship well-being. Sexual activity and sexual health. Lifestyle, including: Alcohol, nicotine or tobacco, and drug use. Access to firearms. Diet, exercise, and sleep habits. Work and work Statistician. Sunscreen use. Safety issues such as seatbelt and bike helmet use. Physical  exam Your health care provider will check your: Height and weight. These may be used to calculate your BMI (body mass index). BMI is a measurement that tells if you are at a healthy weight. Waist circumference. This measures the distance around your waistline. This measurement also tells if you are at a healthy weight and may help predict your risk of certain diseases, such as type 2 diabetes and high blood pressure. Heart rate and blood pressure. Body temperature. Skin for abnormal spots. What immunizations do I need? Vaccines are usually given at various ages, according to a schedule. Your health care provider will recommend vaccines for you based on your age, medical history, and lifestyle or other factors, such as travel or where you work. What tests do I need? Screening Your health care provider may recommend screening tests for certain conditions. This may include: Lipid and cholesterol levels. Diabetes screening. This is done by checking your blood sugar (glucose) after you have not eaten for a while (fasting). Pelvic exam and Pap test. Hepatitis B test. Hepatitis C test. HIV (human immunodeficiency virus) test. STI (sexually transmitted infection) testing, if you are at risk. Lung cancer screening. Colorectal cancer screening. Mammogram. Talk with your health care provider about when you should start having regular mammograms. This may depend on whether you have a family history of breast cancer. BRCA-related cancer screening. This may be done if you have a family history of breast, ovarian, tubal, or peritoneal cancers. Bone density scan. This is done to screen for osteoporosis. Talk with your health care provider about your test results, treatment options, and if necessary, the need for more tests. Follow these instructions at home: Eating and drinking  Eat a diet that includes fresh fruits and vegetables, whole grains, lean protein, and low-fat dairy products. Take vitamin  and  mineral supplements as recommended by your health care provider. Do not drink alcohol if: Your health care provider tells you not to drink. You are pregnant, may be pregnant, or are planning to become pregnant. If you drink alcohol: Limit how much you have to 0-1 drink a day. Know how much alcohol is in your drink. In the [place] one drink equals one 12 oz bottle of beer (355 mL), one 5 oz glass of wine (148 mL), or one 1 oz glass of hard liquor (44 mL). Lifestyle Brush your teeth every morning and night with fluoride toothpaste. Floss one time each day. Exercise for at least 30 minutes 5 or more days each week. Do not use any products that contain nicotine or tobacco. These products include cigarettes, chewing tobacco, and vaping devices, such as e-cigarettes. If you need help quitting, ask your health care provider. Do not use drugs. If you are sexually active, practice safe sex. Use a condom or other form of protection to prevent STIs. If you do not wish to become pregnant, use a form of birth control. If you plan to become pregnant, see your health care provider for a prepregnancy visit. Take aspirin only as told by your health care provider. Make sure that you understand how much to take and what form to take. Work with your health care provider to find out whether it is safe and beneficial for you to take aspirin daily. Find healthy ways to manage stress, such as: Meditation, yoga, or listening to music. Journaling. Talking to a trusted person. Spending time with friends and family. Minimize exposure to UV radiation to reduce your risk of skin cancer. Safety Always wear your seat belt while driving or riding in a vehicle. Do not drive: If you have been drinking alcohol. Do not ride with someone who has been drinking. When you are tired or distracted. While texting. If you have been using any mind-altering substances or drugs. Wear a helmet and other protective equipment during sports  activities. If you have firearms in your house, make sure you follow all gun safety procedures. Seek help if you have been physically or sexually abused. What's next? Visit your health care provider once a year for an annual wellness visit. Ask your health care provider how often you should have your eyes and teeth checked. Stay up to date on all vaccines. This information is not intended to replace advice given to you by your health care provider. Make sure you discuss any questions you have with your health care provider. Document Revised: 06/08/2021 Document Reviewed: 06/08/2021 Elsevier Patient Education  [place].

## 2021-11-01 NOTE — Assessment & Plan Note (Signed)
We discussed treatment options, she agrees with trying Fluoxetine 20 mg daily. CBT also recommended. Instructed about warning signs.

## 2021-11-01 NOTE — Assessment & Plan Note (Addendum)
Problem is not well controlled. Could be contributing to abdominal pain. Adequate fiber and fluid intake, benafiber is a good options 1 tsp bid. Miralax and Bisacodyl 5 mg daily as needed. If not better, we can consider Linzess.

## 2021-11-01 NOTE — Assessment & Plan Note (Signed)
We discussed benefits of wt loss as well as adverse effects of obesity. Consistency with healthy diet and physical activity recommended. 1700 kcal/day. Daily brisk walking for 15-30 min as tolerated.

## 2021-11-01 NOTE — Progress Notes (Signed)
HPI: Ms.Ambria Sunniva Croman is a 45 y.o. female, who is here today for her routine physical. She also has some concerns today.  Last CPE: 03/23/20  Regular exercise 3 or more time per week: Not consistently. Following a healthy diet:  She is drinking sodas 2-3 days and some sweet tea. She is cooking at home. She lives with he daughter. Her son is in college.   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  Immunization History  Administered Date(s) Administered   Influenza Inj Mdck Quad Pf 10/19/2019   Tdap 03/23/2020   Health Maintenance  Topic Date Due   COVID-19 Vaccine (1) 11/17/2021*   Flu Shot  03/24/2022*   Pap Smear  08/25/2024   Tetanus Vaccine  03/23/2030   Hepatitis C Screening: USPSTF Recommendation to screen - Ages 18-79 yo.  Completed   HIV Screening  Completed   Pneumococcal Vaccination  Aged Out   HPV Vaccine  Aged Out  *Topic was postponed. The date shown is not the original due date.  Mammogram done at her gyn's office. She follows with her gyn regularly.  Concerns today: -Fatigue and "no will to do anything" for the past few month. + Stress. Mood swings, feels "sad,emotional." "Scared of dying", afraid of going places, and driving.  She is back to school, part time and working full time.  Sleeping about 3 hours, trouble staying asleep. Not sure about sleep apnea but states that she walks up herself due to loud snoring.  -"Stomach pains" RLQ and LLQ pain, followed with gyn and according to pt, she was referred to GI.  Fibroids x 2 found on pelvic/transvaginal US.  Pain is intermittent, not radiated,4/10. Bowel movements every 1-2 weeks. Metamucil is not longer helping.  Headache , chronic and unchanged. She does not feel rested.  She was on Wellbutrin before , did not feel like it was helping. She also tried Lexapro, made her "numbed" and  grinding teeth. Also frustrated about not losing wt. She would like insulin levels check. She has noted pigmented areas around her neck. Concerned about possibility of diabetes.  Depression screen [facility] 2/9 11/01/2021 03/23/2020 10/09/2018  Decreased Interest 3 3 3   Down, Depressed, Hopeless 3 2 2   PHQ - 2 Score 6 5 5   Altered sleeping 3 3 3   Tired, decreased energy 3 0 3  Change in appetite 3 0 3  Feeling bad or failure about yourself  3 1 3   Trouble concentrating 3 0 1  Moving slowly or fidgety/restless 1 0 0  Suicidal thoughts 0 0 0  PHQ-9 Score 22 9 18   Difficult doing work/chores Extremely dIfficult Somewhat difficult Extremely dIfficult   GAD 7 : Generalized Anxiety Score 11/01/2021  Nervous, Anxious, on Edge 3  Control/stop worrying 3  Worry too much - different things 3  Trouble relaxing 3  Restless 3  Easily annoyed or irritable 3  Afraid - awful might happen 3  Total GAD 7 Score 21  Anxiety Difficulty Very difficult   LE edema , worse at the end of the day.  Review of Systems  Constitutional:  Positive for fatigue. Negative for appetite change and fever.  HENT:  Negative for hearing loss, mouth sores and sore throat.   Eyes:  Negative for redness and visual disturbance.  Respiratory:  Negative for cough, shortness of breath and wheezing.   Cardiovascular:  Negative for chest pain and leg swelling.  Gastrointestinal:  Positive for abdominal distention and  constipation. Negative for nausea and vomiting.       No changes in bowel habits.  Endocrine: Negative for cold intolerance, heat intolerance, polydipsia, polyphagia and polyuria.  Genitourinary:  Negative for decreased urine volume, dysuria, hematuria, vaginal bleeding and vaginal discharge.  Musculoskeletal:  Negative for gait problem and myalgias.  Skin:  Negative for color change and rash.  Allergic/Immunologic: Positive for environmental allergies.  Neurological:  Negative for syncope, facial asymmetry and  weakness.  Hematological:  Negative for adenopathy. Does not bruise/bleed easily.  Psychiatric/Behavioral:  Positive for sleep disturbance. Negative for confusion. The patient is nervous/anxious.   All other systems reviewed and are negative.  Current Outpatient Medications on File Prior to Visit  Medication Sig Dispense Refill   etonogestrel-ethinyl estradiol (NUVARING) 0.12-0.015 MG/24HR vaginal ring Place 1 each vaginally every 28 (twenty-eight) days. Insert vaginally and leave in place for 3 consecutive weeks, then remove for 1 week.     No current facility-administered medications on file prior to visit.   Past Medical History:  Diagnosis Date   Anemia    Anxiety    Asthma    Bell's palsy 03-2013   Depression    Frequent headaches    History of chicken pox    Migraines    Obesity    Vitamin D deficiency    Past Surgical History:  Procedure Laterality Date   CESAREAN SECTION  2011   COLONOSCOPY  10 years ago   in [place] exam. Before 2010   Allergies  Allergen Reactions   Erythromycin Hives   Erythromycin Base     Family History  Problem Relation Age of Onset   Migraines Mother    Arthritis Mother    Depression Mother    Drug abuse Mother    Migraines Maternal Grandmother    Arthritis Maternal Grandmother    COPD Maternal Grandmother    Depression Maternal Grandmother    Hearing loss Maternal Grandmother    Colon polyps Maternal Grandmother    Depression Father    Depression Sister    Asthma Sister    Early death Sister    Asthma Daughter    Asthma Son    Birth defects Son    Arthritis Maternal Grandfather    Heart disease Maternal Grandfather    Arthritis Paternal Grandmother    Depression Sister    Colon cancer Neg Hx    Esophageal cancer Neg Hx    Rectal cancer Neg Hx    Stomach cancer Neg Hx    Social History   Socioeconomic History   Marital status: Divorced    Spouse name: Not on file   Number of children: 2   Years of  education: Not on file   Highest education level: Not on file  Occupational History   Not on file  Tobacco Use   Smoking status: Never   Smokeless tobacco: Never  Vaping Use   Vaping Use: Never used  Substance and Sexual Activity   Alcohol use: Yes    Comment: socially   Drug use: No   Sexual activity: Not Currently  Other Topics Concern   Not on file  Social History Narrative   Not on file   Social Determinants of Health   Financial Resource Strain: Not on file  Food Insecurity: Not on file  Transportation Needs: Not on file  Physical Activity: Not on file  Stress: Not on file  Social Connections: Not on file   Vitals:   11/01/21 1018  BP: 128/80  Pulse: 75  Resp: 16  Temp: 98.1 F (36.7 C)  SpO2: 98%   Body mass index is 41.16 kg/m.  Wt Readings from Last 3 Encounters:  11/01/21 255 lb (115.7 kg)  03/23/20 242 lb (109.8 kg)  01/23/19 246 lb (111.6 kg)   Physical Exam Vitals and nursing note reviewed.  Constitutional:      General: She is not in acute distress.    Appearance: She is well-developed.  HENT:     Head: Normocephalic and atraumatic.     Right Ear: Hearing, tympanic membrane, ear canal and external ear normal.     Left Ear: Hearing, tympanic membrane, ear canal and external ear normal.     Mouth/Throat:     Mouth: Mucous membranes are moist.     Pharynx: Oropharynx is clear. Uvula midline.  Eyes:     Extraocular Movements: Extraocular movements intact.     Conjunctiva/sclera: Conjunctivae normal.     Pupils: Pupils are equal, round, and reactive to light.  Neck:     Thyroid: No thyromegaly.     Trachea: No tracheal deviation.  Cardiovascular:     Rate and Rhythm: Normal rate and regular rhythm.     Pulses:          Dorsalis pedis pulses are 2+ on the right side and 2+ on the left side.     Heart sounds: No murmur heard. Pulmonary:     Effort: Pulmonary effort is normal. No respiratory distress.     Breath sounds: Normal breath sounds.   Abdominal:     Palpations: Abdomen is soft. There is no hepatomegaly or mass.     Tenderness: There is no abdominal tenderness.  Genitourinary:    Comments: Deferred to gyn. Musculoskeletal:     Comments: No major deformity or signs of synovitis appreciated.  Lymphadenopathy:     Cervical: No cervical adenopathy.     Upper Body:     Right upper body: No supraclavicular adenopathy.     Left upper body: No supraclavicular adenopathy.  Skin:    General: Skin is warm.     Findings: No erythema or rash.  Neurological:     General: No focal deficit present.     Mental Status: She is alert and oriented to person, place, and time.     Cranial Nerves: Facial asymmetry (Left facial palsy.) present. No cranial nerve deficit.     Coordination: Coordination normal.     Gait: Gait normal.     Deep Tendon Reflexes:     Reflex Scores:      Bicep reflexes are 2+ on the right side and 2+ on the left side.      Patellar reflexes are 2+ on the right side and 2+ on the left side. Psychiatric:        Mood and Affect: Mood is anxious. Affect is labile.        Speech: Speech normal.     Comments: Well groomed, good eye contact.   ASSESSMENT AND PLAN:  Ms. Liadan Piche was here today annual physical examination.  Orders Placed This Encounter  Procedures   Insulin, Free (Bioactive)   Hemoglobin A1c   TSH   CBC   Comprehensive metabolic panel   Lipid panel   Ambulatory referral to Sleep Studies   Lab Results  Component Value Date   TSH 2.64 11/01/2021   Lab Results  Component Value Date   WBC 6.6 11/01/2021   HGB 10.9 (L) 11/01/2021  HCT 33.8 (L) 11/01/2021   MCV 82.9 11/01/2021   PLT 265.0 11/01/2021   Lab Results  Component Value Date   CREATININE 0.78 11/01/2021   BUN 9 11/01/2021   NA 138 11/01/2021   K 3.8 11/01/2021   CL 107 11/01/2021   CO2 24 11/01/2021   Lab Results  Component Value Date   ALT 11 11/01/2021   AST 7 11/01/2021   ALKPHOS 55  11/01/2021   BILITOT 0.3 11/01/2021   Lab Results  Component Value Date   HGBA1C 5.9 11/01/2021   Routine general medical examination at a health care facility We discussed the importance of regular physical activity and healthy diet for prevention of chronic illness and/or complications. Preventive guidelines reviewed. Vaccination up to date. Continue her routine female care with gyn. Next CPE in a year.  Abdominal pain, unspecified abdominal location We discussed possible etiologies. ? IBS. Pending appt with GI. Instructed about warning signs.  Screening for lipoid disorders -     Lipid panel  Screening for endocrine, metabolic and immunity disorder -     Hemoglobin A1c -     Comprehensive metabolic panel  Patient request for diagnostic testing -     TSH -     Insulin, Free (Bioactive)  Fatigue, unspecified type We discussed possible etiologies: Systemic illness, immunologic,endocrinology,sleep disorder, psychiatric/psychologic, infectious,medications side effects, and idiopathic. Examination today does not suggest a serious process. Healthy diet and regular physical activity may help.  Further recommendations will be given according to lab results.  Sleep apnea, unspecified type Wt loss will help. Sleep study will be arranged.  Major depressive disorder, recurrent episode, severe ([place]) We discussed treatment options, she agrees with trying Fluoxetine 20 mg daily. CBT also recommended. Instructed about warning signs.  GAD (generalized anxiety disorder) Severe. Fluoxetine 20 mg daily started today. CBT recommended.  Constipation Problem is not well controlled. Could be contributing to abdominal pain. Adequate fiber and fluid intake, benafiber is a good options 1 tsp bid. Miralax and Bisacodyl 5 mg daily as needed. If not better, we can consider Linzess.  Severe obesity (BMI 35.0-39.9) with comorbidity ([place]) We discussed benefits of wt loss as well as  adverse effects of obesity. Consistency with healthy diet and physical activity recommended. 1700 kcal/day. Daily brisk walking for 15-30 min as tolerated.   Return in 6 weeks (on 12/13/2021).  Zakry Caso G. [place], MD  [facility]. [facility] office.

## 2021-11-01 NOTE — Assessment & Plan Note (Signed)
Severe. Fluoxetine 20 mg daily started today. CBT recommended.

## 2021-11-02 ENCOUNTER — Encounter: Payer: Self-pay | Admitting: Family Medicine

## 2021-11-02 NOTE — Telephone Encounter (Signed)
In regard to her concerns about perimenopause, we usually do not check hormones because it can be confusing depending of the time we do it. But she can address this concern with her gyn. Thanks, BJ

## 2021-11-07 ENCOUNTER — Encounter: Payer: Self-pay | Admitting: Family Medicine

## 2021-11-07 NOTE — Telephone Encounter (Signed)
I called and spoke with patient. She was concerned about the possible side effects of the Fluoxetine with weight gain and appetite increase. I spoke with Dr. [place], Wellbutrin is not a good option for pt because it will increase the anxiety. Fluoxetine is generally weight neutral. Message sent back to patient.

## 2021-11-09 LAB — INSULIN, FREE (BIOACTIVE): Insulin, Free: 11.3 u[IU]/mL (ref 1.5–14.9)

## 2022-02-16 DIAGNOSIS — S82892A Other fracture of left lower leg, initial encounter for closed fracture: Secondary | ICD-10-CM

## 2022-02-16 HISTORY — DX: Other fracture of left lower leg, initial encounter for closed fracture: S82.892A

## 2022-02-17 DIAGNOSIS — S99912A Unspecified injury of left ankle, initial encounter: Secondary | ICD-10-CM | POA: Diagnosis not present

## 2022-02-17 DIAGNOSIS — S82432A Displaced oblique fracture of shaft of left fibula, initial encounter for closed fracture: Secondary | ICD-10-CM | POA: Diagnosis not present

## 2022-03-02 ENCOUNTER — Other Ambulatory Visit: Payer: Self-pay

## 2022-03-02 ENCOUNTER — Encounter (HOSPITAL_BASED_OUTPATIENT_CLINIC_OR_DEPARTMENT_OTHER): Payer: Self-pay | Admitting: Orthopedic Surgery

## 2022-03-02 NOTE — H&P (Signed)
PREOPERATIVE H&P ? ?Chief Complaint: LEFT ANKLE FRACTURE ? ?HPI: ?Terry Ferguson is a 46 y.o. female who presents with a diagnosis of LEFT ANKLE FRACTURE. Symptoms are rated as moderate to severe, and have been worsening.  This is significantly impairing activities of daily living.  She has elected for surgical management.  ? ?Past Medical History:  ?Diagnosis Date  ? Anemia   ? Anxiety   ? Bell's palsy 03/2013  ? slight facial paralysis left side and left eye dry eye  ? Closed left ankle fracture 02/16/2022  ? wearing  boot, using crutches  ? Depression   ? Frequent headaches   ? History of chicken pox   ? as child  ? History of COVID-19 2020  ? History of COVID-19 06/2021  ? scratch throat, fever, headache, fatigue x 3 days all symptoms resolved  ? Migraines   ? Obesity   ? Vitamin D deficiency   ? ?Past Surgical History:  ?Procedure Laterality Date  ? CESAREAN SECTION  2011  ? COLONOSCOPY    ? in [place] exam. Before 2010 10 years ago  ? ?Social History  ? ?Socioeconomic History  ? Marital status: Divorced  ?  Spouse name: Not on file  ? Number of children: 2  ? Years of education: Not on file  ? Highest education level: Not on file  ?Occupational History  ? Not on file  ?Tobacco Use  ? Smoking status: Never  ? Smokeless tobacco: Never  ?Vaping Use  ? Vaping Use: Never used  ?Substance and Sexual Activity  ? Alcohol use: Yes  ?  Comment: socially  ? Drug use: Yes  ?  Types: Marijuana  ?  Comment: marijuana daily use smokes last used 03-01-2022  ? Sexual activity: Not Currently  ?Other Topics Concern  ? Not on file  ?Social History Narrative  ? Not on file  ? ?Social Determinants of Health  ? ?Financial Resource Strain: Not on file  ?Food Insecurity: Not on file  ?Transportation Needs: Not on file  ?Physical Activity: Not on file  ?Stress: Not on file  ?Social Connections: Not on file  ? ?Family History  ?Problem Relation Age of Onset  ? Migraines Mother   ? Arthritis Mother   ?  Depression Mother   ? Drug abuse Mother   ? Migraines Maternal Grandmother   ? Arthritis Maternal Grandmother   ? COPD Maternal Grandmother   ? Depression Maternal Grandmother   ? Hearing loss Maternal Grandmother   ? Colon polyps Maternal Grandmother   ? Depression Father   ? Depression Sister   ? Asthma Sister   ? Early death Sister   ? Asthma Daughter   ? Asthma Son   ? Birth defects Son   ? Arthritis Maternal Grandfather   ? Heart disease Maternal Grandfather   ? Arthritis Paternal Grandmother   ? Depression Sister   ? Colon cancer Neg Hx   ? Esophageal cancer Neg Hx   ? Rectal cancer Neg Hx   ? Stomach cancer Neg Hx   ? ?Allergies  ?Allergen Reactions  ? Erythromycin Hives  ? ?Prior to Admission medications   ?Medication Sig Start Date End Date Taking? Authorizing Provider  ?etonogestrel-ethinyl estradiol (NUVARING) 0.12-0.015 MG/24HR vaginal ring Place 1 each vaginally every 28 (twenty-eight) days. Insert vaginally and leave in place for 3 consecutive weeks, then remove for 1 week.    [provider]  ?ferrous sulfate 325 (65 FE) MG tablet Take 1  tablet (325 mg total) by mouth daily. 11/01/21   [place], Betty G, MD  ?FLUoxetine (PROZAC) 20 MG capsule Take 1 capsule (20 mg total) by mouth daily. 11/01/21   [place], Betty G, MD  ? ? ? ?Positive ROS: All other systems have been reviewed and were otherwise negative with the exception of those mentioned in the HPI and as above. ? ?Physical Exam: ?General: Alert, no acute distress ?Cardiovascular: No pedal edema ?Respiratory: No cyanosis, no use of accessory musculature ?GI: No organomegaly, abdomen is soft and non-tender ?Skin: No lesions in the area of chief complaint ?Neurologic: Sensation intact distally ?Psychiatric: Patient is competent for consent with normal mood and affect ?Lymphatic: No axillary or cervical lymphadenopathy ? ?MUSCULOSKELETAL: left ankle TTP lateral malleolus, edema and ecchymosis present, ROM limited in all directions d/t pain,  NVI ? ? ?Imaging: 3v ankle xrays show a bimalleolar ankle fracture ? ? ?Assessment: ?LEFT ANKLE FRACTURE ? ?Plan: ?Plan for Procedure(s): ?OPEN REDUCTION INTERNAL FIXATION (ORIF) ANKLE FRACTURE ? ?The risks benefits and alternatives were discussed with the patient including but not limited to the risks of nonoperative treatment, versus surgical intervention including infection, bleeding, nerve injury,  blood clots, cardiopulmonary complications, morbidity, mortality, among others, and they were willing to proceed.  ? ?Weightbearing: NWB LLE ?Orthopedic devices: splint ?Showering: keep splint dry ?Dressing: leave in place until follow up, reinforce PRN ?Medicines: ASA, Norco 10, Mobic, Baclofen, Zofran ? ?Discharge: home ?Follow up: 2 weeks ? ? ? ?Jenne Pane, PA-C ?Office [phone] ?03/02/2022 ?1:24 PM  ?

## 2022-03-02 NOTE — Progress Notes (Addendum)
Spoke w/ via phone for pre-op interview---pt ?Lab needs dos----  urine preg poct             ?Lab results------none ?COVID test -----patient states asymptomatic no test needed ?Arrive at -------1030 am 03-07-2022 ?NPO after MN NO Solid Food.  Clear liquids from MN until---930 am ?Med rec completed ?Medications to take morning of surgery -----Fluoxetine ?Diabetic medication -----n/a ?Patient instructed no nail  or toenail polish to be worn day of surgery ?Patient instructed to bring photo id and insurance card day of surgery ?Patient aware to have Driver (ride ) / caregiver   driver mother Anntionette will stay  for 24 hours after surgery  ?Patient Special Instructions -----pt will bring left  ankle boot and crutches day of surgery ?Pre-Op special Istructions -----none ?Patient verbalized understanding of instructions that were given at this phone interview. ?Patient denies shortness of breath, chest pain, fever, cough at this phone interview.  ?

## 2022-03-07 ENCOUNTER — Encounter (HOSPITAL_BASED_OUTPATIENT_CLINIC_OR_DEPARTMENT_OTHER): Payer: Self-pay | Admitting: Orthopedic Surgery

## 2022-03-07 ENCOUNTER — Ambulatory Visit (HOSPITAL_BASED_OUTPATIENT_CLINIC_OR_DEPARTMENT_OTHER): Payer: PRIVATE HEALTH INSURANCE | Admitting: Anesthesiology

## 2022-03-07 ENCOUNTER — Ambulatory Visit (HOSPITAL_BASED_OUTPATIENT_CLINIC_OR_DEPARTMENT_OTHER)
Admission: RE | Admit: 2022-03-07 | Discharge: 2022-03-07 | Disposition: A | Discharge status: Home / Self Care | Payer: PRIVATE HEALTH INSURANCE | Attending: Orthopedic Surgery | Admitting: Orthopedic Surgery

## 2022-03-07 ENCOUNTER — Encounter (HOSPITAL_BASED_OUTPATIENT_CLINIC_OR_DEPARTMENT_OTHER)
Admission: RE | Disposition: A | Discharge status: Home / Self Care | Payer: Self-pay | Source: Home / Self Care | Attending: Orthopedic Surgery

## 2022-03-07 ENCOUNTER — Other Ambulatory Visit: Payer: Self-pay

## 2022-03-07 ENCOUNTER — Ambulatory Visit (HOSPITAL_BASED_OUTPATIENT_CLINIC_OR_DEPARTMENT_OTHER): Payer: PRIVATE HEALTH INSURANCE

## 2022-03-07 DIAGNOSIS — S82892A Other fracture of left lower leg, initial encounter for closed fracture: Secondary | ICD-10-CM

## 2022-03-07 DIAGNOSIS — F418 Other specified anxiety disorders: Secondary | ICD-10-CM

## 2022-03-07 DIAGNOSIS — X58XXXA Exposure to other specified factors, initial encounter: Secondary | ICD-10-CM | POA: Diagnosis not present

## 2022-03-07 DIAGNOSIS — R519 Headache, unspecified: Secondary | ICD-10-CM | POA: Insufficient documentation

## 2022-03-07 HISTORY — PX: ORIF ANKLE FRACTURE: SHX5408

## 2022-03-07 LAB — POCT PREGNANCY, URINE: Preg Test, Ur: NEGATIVE

## 2022-03-07 SURGERY — OPEN REDUCTION INTERNAL FIXATION (ORIF) ANKLE FRACTURE
Anesthesia: General | Site: Ankle | Laterality: Left

## 2022-03-07 MED ORDER — KETOROLAC TROMETHAMINE 30 MG/ML IJ SOLN
INTRAMUSCULAR | Status: DC | PRN
  Administered 2022-03-07: 30 mg via INTRAVENOUS

## 2022-03-07 MED ORDER — ONDANSETRON HCL 4 MG/2ML IJ SOLN
INTRAMUSCULAR | Status: AC
  Filled 2022-03-07: qty 2

## 2022-03-07 MED ORDER — MIDAZOLAM HCL 5 MG/5ML IJ SOLN
INTRAMUSCULAR | Status: DC | PRN
  Administered 2022-03-07: 2 mg via INTRAVENOUS

## 2022-03-07 MED ORDER — FENTANYL CITRATE (PF) 100 MCG/2ML IJ SOLN
100.0000 ug | Freq: Once | INTRAMUSCULAR | Status: AC
  Administered 2022-03-07: 100 ug via INTRAVENOUS

## 2022-03-07 MED ORDER — FENTANYL CITRATE (PF) 100 MCG/2ML IJ SOLN
INTRAMUSCULAR | Status: AC
  Filled 2022-03-07: qty 2

## 2022-03-07 MED ORDER — FENTANYL CITRATE (PF) 100 MCG/2ML IJ SOLN
INTRAMUSCULAR | Status: AC
Start: 2022-03-07 — End: ?
  Filled 2022-03-07: qty 2

## 2022-03-07 MED ORDER — LIDOCAINE HCL (PF) 2 % IJ SOLN
INTRAMUSCULAR | Status: AC
  Filled 2022-03-07: qty 5

## 2022-03-07 MED ORDER — KETOROLAC TROMETHAMINE 30 MG/ML IJ SOLN: 30.0000 mg | Freq: Once | INTRAMUSCULAR | Status: DC | PRN

## 2022-03-07 MED ORDER — GLYCOPYRROLATE 0.2 MG/ML IJ SOLN
INTRAMUSCULAR | Status: DC | PRN
Start: 2022-03-07 — End: 2022-03-07
  Administered 2022-03-07: .2 mg via INTRAVENOUS

## 2022-03-07 MED ORDER — OXYCODONE HCL 5 MG PO TABS: 5.0000 mg | ORAL_TABLET | Freq: Once | ORAL | Status: DC | PRN

## 2022-03-07 MED ORDER — DEXAMETHASONE SODIUM PHOSPHATE 10 MG/ML IJ SOLN
INTRAMUSCULAR | Status: AC
  Filled 2022-03-07: qty 1

## 2022-03-07 MED ORDER — DEXAMETHASONE SODIUM PHOSPHATE 10 MG/ML IJ SOLN
8.0000 mg | Freq: Once | INTRAMUSCULAR | Status: AC
  Administered 2022-03-07: 8 mg via INTRAVENOUS

## 2022-03-07 MED ORDER — MIDAZOLAM HCL 2 MG/2ML IJ SOLN
INTRAMUSCULAR | Status: AC
  Filled 2022-03-07: qty 2

## 2022-03-07 MED ORDER — KETOROLAC TROMETHAMINE 30 MG/ML IJ SOLN
INTRAMUSCULAR | Status: AC
  Filled 2022-03-07: qty 1

## 2022-03-07 MED ORDER — MIDAZOLAM HCL 2 MG/2ML IJ SOLN
2.0000 mg | Freq: Once | INTRAMUSCULAR | Status: AC
  Administered 2022-03-07: 2 mg via INTRAVENOUS

## 2022-03-07 MED ORDER — CEFAZOLIN SODIUM-DEXTROSE 2-4 GM/100ML-% IV SOLN
INTRAVENOUS | Status: AC
  Filled 2022-03-07: qty 100

## 2022-03-07 MED ORDER — PROPOFOL 10 MG/ML IV BOLUS
INTRAVENOUS | Status: DC | PRN
  Administered 2022-03-07: 200 mg via INTRAVENOUS

## 2022-03-07 MED ORDER — ACETAMINOPHEN 500 MG PO TABS
1000.0000 mg | ORAL_TABLET | Freq: Once | ORAL | Status: AC
  Administered 2022-03-07: 1000 mg via ORAL

## 2022-03-07 MED ORDER — OXYCODONE HCL 5 MG/5ML PO SOLN: 5.0000 mg | Freq: Once | ORAL | Status: DC | PRN

## 2022-03-07 MED ORDER — ACETAMINOPHEN 500 MG PO TABS
ORAL_TABLET | ORAL | Status: AC
  Filled 2022-03-07: qty 2

## 2022-03-07 MED ORDER — LIDOCAINE HCL (CARDIAC) PF 100 MG/5ML IV SOSY
PREFILLED_SYRINGE | INTRAVENOUS | Status: DC | PRN
  Administered 2022-03-07: 100 mg via INTRAVENOUS

## 2022-03-07 MED ORDER — ROPIVACAINE HCL 7.5 MG/ML IJ SOLN
INTRAMUSCULAR | Status: DC | PRN
Start: 2022-03-07 — End: 2022-03-07
  Administered 2022-03-07 (×4): 5 mL via PERINEURAL

## 2022-03-07 MED ORDER — BACLOFEN 10 MG PO TABS
10.0000 mg | ORAL_TABLET | Freq: Three times a day (TID) | ORAL | 0 refills | Status: AC | PRN
Start: 2022-03-07 — End: 2023-03-07

## 2022-03-07 MED ORDER — POVIDONE-IODINE 10 % EX SWAB: 2.0000 "application " | Freq: Once | CUTANEOUS | Status: DC

## 2022-03-07 MED ORDER — HYDROCODONE-ACETAMINOPHEN 10-325 MG PO TABS: 1.0000 | ORAL_TABLET | Freq: Four times a day (QID) | ORAL | 0 refills | Status: AC | PRN

## 2022-03-07 MED ORDER — ASPIRIN EC 81 MG PO TBEC: 81.0000 mg | DELAYED_RELEASE_TABLET | Freq: Two times a day (BID) | ORAL | 0 refills | Status: AC

## 2022-03-07 MED ORDER — PROPOFOL 10 MG/ML IV BOLUS
INTRAVENOUS | Status: AC
  Filled 2022-03-07: qty 20

## 2022-03-07 MED ORDER — MEPERIDINE HCL 25 MG/ML IJ SOLN: 6.2500 mg | INTRAMUSCULAR | Status: DC | PRN

## 2022-03-07 MED ORDER — MIDAZOLAM HCL 2 MG/2ML IJ SOLN
INTRAMUSCULAR | Status: AC
Start: 2022-03-07 — End: ?
  Filled 2022-03-07: qty 2

## 2022-03-07 MED ORDER — LACTATED RINGERS IV SOLN: INTRAVENOUS | Status: DC

## 2022-03-07 MED ORDER — GLYCOPYRROLATE PF 0.2 MG/ML IJ SOSY
PREFILLED_SYRINGE | INTRAMUSCULAR | Status: AC
  Filled 2022-03-07: qty 1

## 2022-03-07 MED ORDER — ACETAMINOPHEN 325 MG PO TABS: 325.0000 mg | ORAL_TABLET | ORAL | Status: DC | PRN

## 2022-03-07 MED ORDER — CEFAZOLIN SODIUM-DEXTROSE 2-4 GM/100ML-% IV SOLN
2.0000 g | INTRAVENOUS | Status: AC
  Administered 2022-03-07: 2 g via INTRAVENOUS

## 2022-03-07 MED ORDER — ACETAMINOPHEN 160 MG/5ML PO SOLN: 325.0000 mg | ORAL | Status: DC | PRN

## 2022-03-07 MED ORDER — ONDANSETRON 4 MG PO TBDP: 4.0000 mg | ORAL_TABLET | Freq: Two times a day (BID) | ORAL | 0 refills | Status: AC | PRN

## 2022-03-07 MED ORDER — ROPIVACAINE HCL 5 MG/ML IJ SOLN
INTRAMUSCULAR | Status: DC | PRN
Start: 2022-03-07 — End: 2022-03-07
  Administered 2022-03-07 (×6): 5 mL via PERINEURAL

## 2022-03-07 MED ORDER — ONDANSETRON HCL 4 MG/2ML IJ SOLN: 4.0000 mg | Freq: Once | INTRAMUSCULAR | Status: DC | PRN

## 2022-03-07 MED ORDER — ONDANSETRON HCL 4 MG/2ML IJ SOLN
INTRAMUSCULAR | Status: DC | PRN
  Administered 2022-03-07: 4 mg via INTRAVENOUS

## 2022-03-07 MED ORDER — FENTANYL CITRATE (PF) 100 MCG/2ML IJ SOLN: 25.0000 ug | INTRAMUSCULAR | Status: DC | PRN

## 2022-03-07 MED ORDER — FENTANYL CITRATE (PF) 100 MCG/2ML IJ SOLN
INTRAMUSCULAR | Status: DC | PRN
Start: 2022-03-07 — End: 2022-03-07
  Administered 2022-03-07: 100 ug via INTRAVENOUS
  Administered 2022-03-07: 50 ug via INTRAVENOUS
  Administered 2022-03-07 (×2): 25 ug via INTRAVENOUS

## 2022-03-07 MED ORDER — MELOXICAM 15 MG PO TABS: 15.0000 mg | ORAL_TABLET | Freq: Every day | ORAL | 0 refills | Status: AC

## 2022-03-07 SURGICAL SUPPLY — 75 items
APL PRP STRL LF DISP 70% ISPRP (MISCELLANEOUS) ×1
BANDAGE ESMARK 6X9 LF (GAUZE/BANDAGES/DRESSINGS) ×1 IMPLANT
BIT DRILL 2.6 (BIT) ×2
BIT DRILL 2.6X VARIAX 2 (BIT) IMPLANT
BIT DRILL 3.5X122MM AO FIT (BIT) ×1 IMPLANT
BIT DRL 2.6X VARIAX 2 (BIT) ×1
BLADE SURG 15 STRL LF DISP TIS (BLADE) ×2 IMPLANT
BLADE SURG 15 STRL SS (BLADE) ×4
BNDG CMPR 9X6 STRL LF SNTH (GAUZE/BANDAGES/DRESSINGS) ×1
BNDG COHESIVE 4X5 TAN STRL (GAUZE/BANDAGES/DRESSINGS) ×2 IMPLANT
BNDG ELASTIC 4X5.8 VLCR STR LF (GAUZE/BANDAGES/DRESSINGS) ×2 IMPLANT
BNDG ELASTIC 6X5.8 VLCR STR LF (GAUZE/BANDAGES/DRESSINGS) ×2 IMPLANT
BNDG ESMARK 6X9 LF (GAUZE/BANDAGES/DRESSINGS) ×2
BNDG GAUZE ELAST 4 BULKY (GAUZE/BANDAGES/DRESSINGS) ×2 IMPLANT
CHLORAPREP W/TINT 26 (MISCELLANEOUS) ×2 IMPLANT
CLSR STERI-STRIP ANTIMIC 1/2X4 (GAUZE/BANDAGES/DRESSINGS) ×1 IMPLANT
COVER BACK TABLE 60X90IN (DRAPES) ×2 IMPLANT
COVER MAYO STAND STRL (DRAPES) ×2 IMPLANT
CUFF TOURN SGL QUICK 24 (TOURNIQUET CUFF)
CUFF TOURN SGL QUICK 34 (TOURNIQUET CUFF)
CUFF TRNQT CYL 24X4X16.5-23 (TOURNIQUET CUFF) IMPLANT
CUFF TRNQT CYL 34X4.125X (TOURNIQUET CUFF) IMPLANT
DECANTER SPIKE VIAL GLASS SM (MISCELLANEOUS) IMPLANT
DRAPE C-ARM 35X43 STRL (DRAPES) ×2 IMPLANT
DRAPE EXTREMITY T 121X128X90 (DISPOSABLE) ×2 IMPLANT
DRAPE IMP U-DRAPE 54X76 (DRAPES) ×2 IMPLANT
DRAPE U-SHAPE 47X51 STRL (DRAPES) ×2 IMPLANT
DRSG EMULSION OIL 3X3 NADH (GAUZE/BANDAGES/DRESSINGS) ×2 IMPLANT
DRSG PAD ABDOMINAL 8X10 ST (GAUZE/BANDAGES/DRESSINGS) ×2 IMPLANT
ELECT REM PT RETURN 9FT ADLT (ELECTROSURGICAL) ×2
ELECTRODE REM PT RTRN 9FT ADLT (ELECTROSURGICAL) ×1 IMPLANT
GAUZE 4X4 16PLY ~~LOC~~+RFID DBL (SPONGE) ×2 IMPLANT
GAUZE SPONGE 4X4 12PLY STRL (GAUZE/BANDAGES/DRESSINGS) ×2 IMPLANT
GLOVE SRG 8 PF TXTR STRL LF DI (GLOVE) ×2 IMPLANT
GLOVE SURG ENC MOIS LTX SZ7.5 (GLOVE) ×4 IMPLANT
GLOVE SURG UNDER POLY LF SZ8 (GLOVE) ×4
GOWN STRL REUS W/TWL LRG LVL3 (GOWN DISPOSABLE) ×6 IMPLANT
IMPL TIGHTROP W/DRV K-LESS (Anchor) IMPLANT
IMPLANT TIGHTROPE W/DRV K-LESS (Anchor) ×2 IMPLANT
KIT TURNOVER CYSTO (KITS) ×2 IMPLANT
NEEDLE HYPO 22GX1.5 SAFETY (NEEDLE) IMPLANT
NS IRRIG 1000ML POUR BTL (IV SOLUTION) ×2 IMPLANT
PACK BASIN DAY SURGERY FS (CUSTOM PROCEDURE TRAY) ×2 IMPLANT
PAD CAST 4YDX4 CTTN HI CHSV (CAST SUPPLIES) ×1 IMPLANT
PADDING CAST ABS 4INX4YD NS (CAST SUPPLIES) ×2
PADDING CAST ABS COTTON 4X4 ST (CAST SUPPLIES) ×2 IMPLANT
PADDING CAST COTTON 4X4 STRL (CAST SUPPLIES) ×2
PADDING CAST COTTON 6X4 STRL (CAST SUPPLIES) ×2 IMPLANT
PENCIL SMOKE EVACUATOR (MISCELLANEOUS) ×2 IMPLANT
PLATE 1/3 TUBULAR 7H (Plate) ×1 IMPLANT
SCREW CANC 2.5XFT HEX12X4X (Screw) IMPLANT
SCREW CANCELLOUS 4.0X12MM (Screw) ×4 IMPLANT
SCREW CANCELLOUS 4.0X14 (Screw) ×2 IMPLANT
SCREW CORTEX ST MATTA 3.5X12MM (Screw) ×2 IMPLANT
SCREW CORTEX ST MATTA 3.5X14 (Screw) ×2 IMPLANT
SCREW CORTEX ST MATTA 3.5X18MM (Screw) ×1 IMPLANT
SPLINT FAST PLASTER 5X30 (CAST SUPPLIES) ×20
SPLINT PLASTER CAST FAST 5X30 (CAST SUPPLIES) ×20 IMPLANT
SPONGE T-LAP 4X18 ~~LOC~~+RFID (SPONGE) ×2 IMPLANT
STRIP CLOSURE SKIN 1/2X4 (GAUZE/BANDAGES/DRESSINGS) ×2 IMPLANT
SUCTION FRAZIER HANDLE 10FR (MISCELLANEOUS) ×2
SUCTION TUBE FRAZIER 10FR DISP (MISCELLANEOUS) ×1 IMPLANT
SUT ETHILON 3 0 PS 1 (SUTURE) IMPLANT
SUT MNCRL AB 4-0 PS2 18 (SUTURE) ×1 IMPLANT
SUT MON AB 2-0 CT1 36 (SUTURE) ×1 IMPLANT
SUT MON AB 3-0 SH 27 (SUTURE)
SUT MON AB 3-0 SH27 (SUTURE) IMPLANT
SUT VIC AB 0 CT1 36 (SUTURE) ×2 IMPLANT
SUT VIC AB 2-0 SH 27 (SUTURE) ×2
SUT VIC AB 2-0 SH 27XBRD (SUTURE) IMPLANT
SYR BULB EAR ULCER 3OZ GRN STR (SYRINGE) ×2 IMPLANT
SYR CONTROL 10ML LL (SYRINGE) IMPLANT
TOWEL OR 17X26 10 PK STRL BLUE (TOWEL DISPOSABLE) ×4 IMPLANT
TUBE CONNECTING 12X1/4 (SUCTIONS) ×2 IMPLANT
UNDERPAD 30X36 HEAVY ABSORB (UNDERPADS AND DIAPERS) ×2 IMPLANT

## 2022-03-07 NOTE — Anesthesia Procedure Notes (Signed)
Anesthesia Regional Block: Adductor canal block  ? ?Pre-Anesthetic Checklist: , timeout performed,  Correct Patient, Correct Site, Correct Laterality,  Correct Procedure, Correct Position, site marked,  Risks and benefits discussed,  Surgical consent,  Pre-op evaluation,  At surgeon's request and post-op pain management ? ?Laterality: Lower and Left ? ?Prep: chloraprep     ?  ?Needles:  ?Injection technique: Single-shot ? ?Needle Type: Echogenic Stimulator Needle   ? ? ?Needle Length: 9cm  ?Needle Gauge: 20  ? ?Needle insertion depth: 4 cm ? ? ?Additional Needles: ? ? ?Procedures:,,,, ultrasound used (permanent image in chart),,    ?Narrative:  ?Start time: 03/07/2022 7:55 AM ?End time: 03/07/2022 8:03 AM ?Injection made incrementally with aspirations every 5 mL. ?Anesthesiologist: Leilani Able, MD ? ? ? ? ?

## 2022-03-07 NOTE — Discharge Instructions (Addendum)
POST-OPERATIVE OPIOID TAPER INSTRUCTIONS: It is important to wean off of your opioid medication as soon as possible. If you do not need pain medication after your surgery it is ok to stop day one. Opioids include: Codeine, Hydrocodone(Norco, Vicodin), Oxycodone(Percocet, oxycontin) and hydromorphone amongst others.  Long term and even short term use of opiods can cause: Increased pain response Dependence Constipation Depression Respiratory depression And more.  Withdrawal symptoms can include Flu like symptoms Nausea, vomiting And more Techniques to manage these symptoms Hydrate well Eat regular healthy meals Stay active Use relaxation techniques(deep breathing, meditating, yoga) Do Not substitute Alcohol to help with tapering If you have been on opioids for less than two weeks and do not have pain than it is ok to stop all together.  Plan to wean off of opioids This plan should start within one week post op of your joint replacement. Maintain the same interval or time between taking each dose and first decrease the dose.  Cut the total daily intake of opioids by one tablet each day Next start to increase the time between doses. The last dose that should be eliminated is the evening dose.    Regional Anesthesia Blocks  1. Numbness or the inability to move the "blocked" extremity may last from 3-48 hours after placement. The length of time depends on the medication injected and your individual response to the medication. If the numbness is not going away after 48 hours, call your surgeon.  2. The extremity that is blocked will need to be protected until the numbness is gone and the  Strength has returned. Because you cannot feel it, you will need to take extra care to avoid injury. Because it may be weak, you may have difficulty moving it or using it. You may not know what position it is in without looking at it while the block is in effect.  3. For blocks in the legs and feet,  returning to weight bearing and walking needs to be done carefully. You will need to wait until the numbness is entirely gone and the strength has returned. You should be able to move your leg and foot normally before you try and bear weight or walk. You will need someone to be with you when you first try to ensure you do not fall and possibly risk injury.  4. Bruising and tenderness at the needle site are common side effects and will resolve in a few days.  5. Persistent numbness or new problems with movement should be communicated to the surgeon or the [facility] ([phone])/ [facility] ([phone]).  Post Anesthesia Home Care Instructions  Activity: Get plenty of rest for the remainder of the day. A responsible individual must stay with you for 24 hours following the procedure.  For the next 24 hours, DO NOT: -Drive a car -Operate machinery -Drink alcoholic beverages -Take any medication unless instructed by your physician -Make any legal decisions or sign important papers.  Meals: Start with liquid foods such as gelatin or soup. Progress to regular foods as tolerated. Avoid greasy, spicy, heavy foods. If nausea and/or vomiting occur, drink only clear liquids until the nausea and/or vomiting subsides. Call your physician if vomiting continues.  Special Instructions/Symptoms: Your throat may feel dry or sore from the anesthesia or the breathing tube placed in your throat during surgery. If this causes discomfort, gargle with warm salt water. The discomfort should disappear within 24 hours. 

## 2022-03-07 NOTE — Anesthesia Preprocedure Evaluation (Signed)
Anesthesia Evaluation  ?Patient identified by MRN, date of birth, ID band ?Patient awake ? ? ? ?Airway ?Mallampati: II ? ? ? ? ? ? Dental ?no notable dental hx. ? ?  ?Pulmonary ?neg pulmonary ROS,  ?  ?Pulmonary exam normal ? ? ? ? ? ? ? Cardiovascular ?negative cardio ROS ?Normal cardiovascular exam ? ? ?  ?Neuro/Psych ? Headaches, Anxiety Depression   ? GI/Hepatic ?negative GI ROS,   ?Endo/Other  ?Morbid obesity ? Renal/GU ?  ? ?  ?Musculoskeletal ? ? Abdominal ?(+) + obese,   ?Peds ? Hematology ?  ?Anesthesia Other Findings ? ? Reproductive/Obstetrics ?negative OB ROS ? ?  ? ? ? ? ? ? ? ? ? ? ? ? ? ?  ?  ? ? ? ? ? ? ? ? ?Anesthesia Physical ?Anesthesia Plan ? ?ASA: 3 ? ?Anesthesia Plan: General  ? ?Post-op Pain Management: Regional block*  ? ?Induction: Intravenous ? ?PONV Risk Score and Plan: 3 and Ondansetron, Dexamethasone and Midazolam ? ?Airway Management Planned: LMA ? ?Additional Equipment: None ? ?Intra-op Plan:  ? ?Post-operative Plan: Extubation in OR ? ?Informed Consent: I have reviewed the patients History and Physical, chart, labs and discussed the procedure including the risks, benefits and alternatives for the proposed anesthesia with the patient or authorized representative who has indicated his/her understanding and acceptance.  ? ? ? ?Dental advisory given ? ?Plan Discussed with: CRNA ? ?Anesthesia Plan Comments:   ? ? ? ? ? ? ?Anesthesia Quick Evaluation ? ?

## 2022-03-07 NOTE — Interval H&P Note (Signed)
History and Physical Interval Note: ? ?03/07/2022 ?7:14 AM ? ?Terry Ferguson  has presented today for surgery, with the diagnosis of LEFT ANKLE FRACTURE.  The various methods of treatment have been discussed with the patient and family. After consideration of risks, benefits and other options for treatment, the patient has consented to  Procedure(s): ?OPEN REDUCTION INTERNAL FIXATION (ORIF) ANKLE FRACTURE (Left) as a surgical intervention.  The patient's history has been reviewed, patient examined, no change in status, stable for surgery.  I have reviewed the patient's chart and labs.  Questions were answered to the patient's satisfaction.   ? ? ?[name] ? ? ?

## 2022-03-07 NOTE — Progress Notes (Signed)
Assisted Dr. Hatchett with left, ultrasound guided, popliteal, adductor canal block. Side rails up, monitors on throughout procedure. See vital signs in flow sheet. Tolerated Procedure well.  

## 2022-03-07 NOTE — Anesthesia Postprocedure Evaluation (Signed)
Anesthesia Post Note ? ?Patient: Sharee Sturdy ? ?Procedure(s) Performed: OPEN REDUCTION INTERNAL FIXATION (ORIF) ANKLE FRACTURE AND SYNDESMOSIS FIXATION (Left: Ankle) ? ?  ? ?Patient location during evaluation: PACU ?Anesthesia Type: General ?Level of consciousness: awake ?Pain management: pain level controlled ?Vital Signs Assessment: post-procedure vital signs reviewed and stable ?Respiratory status: spontaneous breathing ?Cardiovascular status: stable ?Postop Assessment: no apparent nausea or vomiting ?Anesthetic complications: no ? ? ?No notable events documented. ? ?Last Vitals:  ?Vitals:  ? 03/07/22 1045 03/07/22 1100  ?BP: 124/67 120/61  ?Pulse: 69 69  ?Resp: 16 17  ?Temp:    ?SpO2: 98% 100%  ?  ?Last Pain:  ?Vitals:  ? 03/07/22 1037  ?TempSrc:   ?PainSc: 0-No pain  ? ? ?  ?  ?  ?  ?  ?  ? ?Caren Macadam ? ? ? ? ?

## 2022-03-07 NOTE — Op Note (Signed)
03/07/2022 ? ?10:07 AM ? ?PATIENT:  Terry Ferguson   ? ?PRE-OPERATIVE DIAGNOSIS:  LEFT ANKLE FRACTURE ? ?POST-OPERATIVE DIAGNOSIS:  Same ? ?PROCEDURE:  OPEN REDUCTION INTERNAL FIXATION (ORIF) ANKLE FRACTURE AND SYNDESMOSIS FIXATION ? ?SURGEON:  Renette Butters, MD ? ?ASSISTANT: Aggie Moats, PA-C, he was present and scrubbed throughout the case, critical for completion in a timely fashion, and for retraction, instrumentation, and closure. ? ? ?ANESTHESIA:   gen  ? ?PREOPERATIVE INDICATIONS:  Terry Ferguson is a  46 y.o. female with a diagnosis of LEFT ANKLE FRACTURE who failed conservative measures and elected for surgical management.   ? ?The risks benefits and alternatives were discussed with the patient preoperatively including but not limited to the risks of infection, bleeding, nerve injury, cardiopulmonary complications, the need for revision surgery, among others, and the patient was willing to proceed. ? ?OPERATIVE IMPLANTS: stryker plate and tight rope ? ?OPERATIVE FINDINGS: Unstable ankle fracture. Stable syndesmosis post op ? ?BLOOD LOSS: min ? ?COMPLICATIONS: none ? ?TOURNIQUET TIME: 8min ? ?OPERATIVE PROCEDURE:  Patient was identified in the preoperative holding area and site was marked by me He was transported to the operating theater and placed on the table in supine position taking care to pad all bony prominences. After a preincinduction time out anesthesia was induced. The left  lower extremity was prepped and draped in normal sterile fashion and a pre-incision timeout was performed. Terry Ferguson received ancef for preoperative antibiotics.  ? ?I made a lateral incision of roughly 7 cm dissection was carried down sharply to the distal fibula and then spreading dissection was used proximally to protect the superficial peroneal nerve. I sharply incised the periosteum and took care to protect the peroneal tendons. I then debrided the fracture site and performed a  reduction maneuver which was held in place with a clamp.  ? ?I placed a lag screw across the fracture ? ?I then selected a 7-hole one third tubular plate and placed in a neutralization fashion care was taken distally so as not to penetrate the joint with the cancellus screws. ? ?The medial fracture was very small and did not require fixation ? ?I then stressed the syndesmosis and it was unstable for syndesmotic fixation I placed a tightrope ? ?The wound was then thoroughly irrigated and closed using a 0 Vicryl and absorbable Monocryl sutures. He was placed in a short leg splint. ?  ?POST OPERATIVE PLAN: Non-weightbearing. DVT prophylaxis will consist of mobilization and chemical px ? ? ? ?

## 2022-03-07 NOTE — Anesthesia Procedure Notes (Signed)
Anesthesia Regional Block: Popliteal block  ? ?Pre-Anesthetic Checklist: , timeout performed,  Correct Patient, Correct Site, Correct Laterality,  Correct Procedure, Correct Position, site marked,  Risks and benefits discussed,  Surgical consent,  Pre-op evaluation,  At surgeon's request and post-op pain management ? ?Laterality: Lower and Left ? ?Prep: chloraprep     ?  ?Needles:  ?Injection technique: Single-shot ? ?Needle Type: Echogenic Stimulator Needle   ? ? ?Needle Length: 9cm  ?Needle Gauge: 20  ? ?Needle insertion depth: 2 cm ? ? ?Additional Needles: ? ? ?Procedures:,,,, ultrasound used (permanent image in chart),,    ?Narrative:  ?Start time: 03/07/2022 8:05 AM ?End time: 03/07/2022 8:11 AM ?Injection made incrementally with aspirations every 5 mL. ? ?Performed by: Personally  ?Anesthesiologist: Leilani Able, MD ? ? ? ? ?

## 2022-03-07 NOTE — Transfer of Care (Signed)
Immediate Anesthesia Transfer of Care Note ? ?Patient: Terry Ferguson ? ?Procedure(s) Performed: OPEN REDUCTION INTERNAL FIXATION (ORIF) ANKLE FRACTURE AND SYNDESMOSIS FIXATION (Left: Ankle) ? ?Patient Location: PACU ? ?Anesthesia Type:General ? ?Level of Consciousness: awake, alert  and oriented ? ?Airway & Oxygen Therapy: Patient Spontanous Breathing and Patient connected to nasal cannula oxygen ? ?Post-op Assessment: Report given to RN and Post -op Vital signs reviewed and stable ? ?Post vital signs: Reviewed and stable ? ?Last Vitals:  ?Vitals Value Taken Time  ?BP 124/74 03/07/22 1036  ?Temp    ?Pulse 74 03/07/22 1037  ?Resp 17 03/07/22 1037  ?SpO2 100 % 03/07/22 1037  ?Vitals shown include unvalidated device data. ? ?Last Pain:  ?Vitals:  ? 03/07/22 0712  ?TempSrc: Oral  ?PainSc: 3   ?   ? ?Patients Stated Pain Goal: 5 (03/07/22 7034) ? ?Complications: No notable events documented. ?

## 2022-03-07 NOTE — Addendum Note (Signed)
Addendum  created 03/07/22 1139 by Leilani Able, MD  ? Child order released for a procedure order, Clinical Note Signed, Intraprocedure Blocks edited, Intraprocedure Meds edited, SmartForm saved  ?  ?

## 2022-03-07 NOTE — Anesthesia Procedure Notes (Signed)
Procedure Name: LMA Insertion ?Date/Time: 03/07/2022 9:10 AM ?Performed by: Cleda Clarks, CRNA ?Pre-anesthesia Checklist: Patient identified, Emergency Drugs available, Suction available and Patient being monitored ?Patient Re-evaluated:Patient Re-evaluated prior to induction ?Oxygen Delivery Method: Circle system utilized ?Preoxygenation: Pre-oxygenation with 100% oxygen ?Induction Type: IV induction ?Ventilation: Mask ventilation without difficulty ?LMA: LMA inserted ?LMA Size: 4.0 ?Number of attempts: 1 ?Airway Equipment and Method: Bite block ?Placement Confirmation: positive ETCO2 ?Tube secured with: Tape ?Dental Injury: Teeth and Oropharynx as per pre-operative assessment  ? ? ? ? ?

## 2022-03-08 ENCOUNTER — Encounter (HOSPITAL_BASED_OUTPATIENT_CLINIC_OR_DEPARTMENT_OTHER): Payer: Self-pay | Admitting: Orthopedic Surgery

## 2022-07-22 DIAGNOSIS — Z1159 Encounter for screening for other viral diseases: Secondary | ICD-10-CM | POA: Diagnosis not present

## 2022-07-22 DIAGNOSIS — R5383 Other fatigue: Secondary | ICD-10-CM | POA: Diagnosis not present

## 2022-07-22 DIAGNOSIS — Z1331 Encounter for screening for depression: Secondary | ICD-10-CM | POA: Diagnosis not present

## 2022-07-22 DIAGNOSIS — E8881 Metabolic syndrome: Secondary | ICD-10-CM | POA: Diagnosis not present

## 2022-07-22 DIAGNOSIS — E559 Vitamin D deficiency, unspecified: Secondary | ICD-10-CM | POA: Diagnosis not present

## 2022-07-22 DIAGNOSIS — R0602 Shortness of breath: Secondary | ICD-10-CM | POA: Diagnosis not present

## 2022-07-22 DIAGNOSIS — Z6839 Body mass index (BMI) 39.0-39.9, adult: Secondary | ICD-10-CM | POA: Diagnosis not present

## 2022-07-22 DIAGNOSIS — D539 Nutritional anemia, unspecified: Secondary | ICD-10-CM | POA: Diagnosis not present

## 2022-07-22 DIAGNOSIS — R635 Abnormal weight gain: Secondary | ICD-10-CM | POA: Diagnosis not present

## 2022-07-22 DIAGNOSIS — E669 Obesity, unspecified: Secondary | ICD-10-CM | POA: Diagnosis not present

## 2022-07-22 DIAGNOSIS — E7800 Pure hypercholesterolemia, unspecified: Secondary | ICD-10-CM | POA: Diagnosis not present

## 2022-07-22 DIAGNOSIS — E349 Endocrine disorder, unspecified: Secondary | ICD-10-CM | POA: Diagnosis not present

## 2022-07-22 DIAGNOSIS — N914 Secondary oligomenorrhea: Secondary | ICD-10-CM | POA: Diagnosis not present

## 2022-07-22 DIAGNOSIS — Z131 Encounter for screening for diabetes mellitus: Secondary | ICD-10-CM | POA: Diagnosis not present

## 2022-07-22 DIAGNOSIS — Z79899 Other long term (current) drug therapy: Secondary | ICD-10-CM | POA: Diagnosis not present

## 2022-07-25 DIAGNOSIS — Z79899 Other long term (current) drug therapy: Secondary | ICD-10-CM | POA: Diagnosis not present

## 2022-08-07 DIAGNOSIS — R9431 Abnormal electrocardiogram [ECG] [EKG]: Secondary | ICD-10-CM | POA: Diagnosis not present

## 2022-08-11 DIAGNOSIS — R9431 Abnormal electrocardiogram [ECG] [EKG]: Secondary | ICD-10-CM | POA: Diagnosis not present

## 2022-08-19 DIAGNOSIS — E611 Iron deficiency: Secondary | ICD-10-CM | POA: Diagnosis not present

## 2022-08-19 DIAGNOSIS — E785 Hyperlipidemia, unspecified: Secondary | ICD-10-CM | POA: Diagnosis not present

## 2022-08-19 DIAGNOSIS — E669 Obesity, unspecified: Secondary | ICD-10-CM | POA: Diagnosis not present

## 2022-08-19 DIAGNOSIS — E8881 Metabolic syndrome: Secondary | ICD-10-CM | POA: Diagnosis not present

## 2022-08-19 DIAGNOSIS — Z6838 Body mass index (BMI) 38.0-38.9, adult: Secondary | ICD-10-CM | POA: Diagnosis not present

## 2022-09-19 DIAGNOSIS — Z6837 Body mass index (BMI) 37.0-37.9, adult: Secondary | ICD-10-CM | POA: Diagnosis not present

## 2022-09-19 DIAGNOSIS — E785 Hyperlipidemia, unspecified: Secondary | ICD-10-CM | POA: Diagnosis not present

## 2022-09-19 DIAGNOSIS — R7303 Prediabetes: Secondary | ICD-10-CM | POA: Diagnosis not present

## 2022-09-19 DIAGNOSIS — E669 Obesity, unspecified: Secondary | ICD-10-CM | POA: Diagnosis not present

## 2022-09-19 DIAGNOSIS — R635 Abnormal weight gain: Secondary | ICD-10-CM | POA: Diagnosis not present

## 2022-09-19 DIAGNOSIS — E611 Iron deficiency: Secondary | ICD-10-CM | POA: Diagnosis not present

## 2022-10-09 DIAGNOSIS — Z01419 Encounter for gynecological examination (general) (routine) without abnormal findings: Secondary | ICD-10-CM | POA: Diagnosis not present

## 2022-10-09 DIAGNOSIS — Z1231 Encounter for screening mammogram for malignant neoplasm of breast: Secondary | ICD-10-CM | POA: Diagnosis not present

## 2022-10-09 DIAGNOSIS — Z6837 Body mass index (BMI) 37.0-37.9, adult: Secondary | ICD-10-CM | POA: Diagnosis not present

## 2022-10-09 DIAGNOSIS — Z3040 Encounter for surveillance of contraceptives, unspecified: Secondary | ICD-10-CM | POA: Diagnosis not present

## 2022-10-18 DIAGNOSIS — Z6836 Body mass index (BMI) 36.0-36.9, adult: Secondary | ICD-10-CM | POA: Diagnosis not present

## 2022-10-18 DIAGNOSIS — E785 Hyperlipidemia, unspecified: Secondary | ICD-10-CM | POA: Diagnosis not present

## 2022-10-18 DIAGNOSIS — E669 Obesity, unspecified: Secondary | ICD-10-CM | POA: Diagnosis not present

## 2022-10-18 DIAGNOSIS — R635 Abnormal weight gain: Secondary | ICD-10-CM | POA: Diagnosis not present

## 2022-10-18 DIAGNOSIS — R7303 Prediabetes: Secondary | ICD-10-CM | POA: Diagnosis not present

## 2022-10-18 DIAGNOSIS — R030 Elevated blood-pressure reading, without diagnosis of hypertension: Secondary | ICD-10-CM | POA: Diagnosis not present

## 2022-11-29 DIAGNOSIS — R635 Abnormal weight gain: Secondary | ICD-10-CM | POA: Diagnosis not present

## 2022-11-29 DIAGNOSIS — Z6835 Body mass index (BMI) 35.0-35.9, adult: Secondary | ICD-10-CM | POA: Diagnosis not present

## 2022-11-29 DIAGNOSIS — E611 Iron deficiency: Secondary | ICD-10-CM | POA: Diagnosis not present

## 2022-11-29 DIAGNOSIS — R7303 Prediabetes: Secondary | ICD-10-CM | POA: Diagnosis not present

## 2022-11-29 DIAGNOSIS — E785 Hyperlipidemia, unspecified: Secondary | ICD-10-CM | POA: Diagnosis not present

## 2023-06-19 ENCOUNTER — Ambulatory Visit: Payer: BC Managed Care – PPO | Admitting: Neurology

## 2023-06-19 ENCOUNTER — Encounter: Payer: Self-pay | Admitting: Neurology

## 2023-06-19 VITALS — BP 108/64 | HR 63 | Ht 67.0 in | Wt 215.0 lb

## 2023-06-19 DIAGNOSIS — G510 Bell's palsy: Secondary | ICD-10-CM

## 2023-06-19 NOTE — Patient Instructions (Signed)
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 Bell's palsy usually gets better with time. Use artificial tears to your affected eye during the day as needed and use a lubricant ointment in that eye at night and keep your eye closed at night by using an eye patch, if needed, but your eye closure is quite good now.  Consult with your eye doctor if you need any additional evaluation, especially if you develop eye pain and redness. The most dangerous complication from Bell's palsy is corneal infection or damage.   We will do a brain MRI with and without contrast and some blood work today.   We don't have to make a follow up appointment. Your neurological exam is good otherwise.

## 2023-06-19 NOTE — Progress Notes (Signed)
Subjective:    Patient ID: Terry Ferguson is a 47 y.o. female.  HPI    Huston Foley, MD, PhD [facility] [address] [address] [place], [place] [address]  Dear Dr. Judy Pimple,  I saw your patient, Terry Ferguson, upon your kind request of my neurologic clinic today for evaluation of her Bell's palsy.  The patient is unaccompanied today.  As you know, Terry Ferguson is a 47 year old female with an underlying medical history of anemia, chronic constipation, headaches, vitamin D deficiency, anxiety, depression, left ankle fracture with status post ORIF in March 2023, constipation, uterine leiomyoma, and obesity, who reports a longstanding history of Bell's palsy.  She had seen Dr. Smiley Houseman at [facility] neurology in April 2014 through June 2014 for Bell's palsy and subsequently saw Dr. Everlena Cooper at [facility] neurology in August 2014 for Bell's palsy.  She had been given a prescription for prednisone and acyclovir in April 2014 but reported in her August 2014 appointment that she did not take the medications.  At that time she was prescribed prednisone and Valtrex and as far as she recalls she did take these medicines at the time.  Blood work showed negative Lyme test, ANA negative, ESR 22, RPR nonreactive and low vitamin D at the time.  She was felt to have residual deficits.  She was advised to continue to use Lacri-Lube and see an ophthalmologist if she were to experience any change in vision.  She was tried on baclofen 5 mg 3 times daily for facial muscle spasms. 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  Sometimes it is hard for her to chew certain foods as her mouth on the left side does not close completely.  She does not use an eye patch any longer and has not used any eyedrops but was encouraged by her eye doctor to use eyedrops for dry eyes, she was given a sample and was advised to buy it over-the-counter afterwards, she  ended up not using any eyedrops for fear of infections from the eyedrops.  She is willing to circle back to her eye doctor and ask for additional advice.  She sees weight management through [facility] and is currently on phentermine and Ozempic.  She was started on Trulance for severe constipation.  She denies any sudden onset of one-sided weakness or numbness or tingling or droopy face or slurring of speech otherwise.  Overall, her left facial weakness improved to some degree but has since then plateaued.  She has residual weakness and is somewhat self-conscious about it still now but has learned to live with it.  She ended up not having an MRI at the time of her original diagnosis as she could not afford it as I understand.  She would be willing to consider a brain MRI at this time.  She tries to hydrate well with water, she admits that she used to have a lot of stress and is working through stressors, no longer has a prescription for Prozac but may be willing to go back to her primary care to discuss stress management.  She lives with her 2 children.  She works for [employer].  She is a non-smoker and drinks alcohol occasionally and has cut back on caffeine from daily soda consumption to once or twice a week.  Her Past Medical History Is Significant For: Past Medical History:  Diagnosis Date   Anemia  Anxiety    Bell's palsy 03/2013   slight facial paralysis left side and left eye dry eye   Closed left ankle fracture 02/16/2022   wearing  boot, using crutches   Depression    Frequent headaches    History of chicken pox    as child   History of COVID-19 2020   History of COVID-19 06/2021   scratch throat, fever, headache, fatigue x 3 days all symptoms resolved   Migraines    Obesity    Vitamin D deficiency     Her Past Surgical History Is Significant For: Past Surgical History:  Procedure Laterality Date   CESAREAN SECTION  2011   COLONOSCOPY     in [place]normal  exam. Before 2010 10 years ago   ORIF ANKLE FRACTURE Left 03/07/2022   Procedure: OPEN REDUCTION INTERNAL FIXATION (ORIF) ANKLE FRACTURE AND SYNDESMOSIS FIXATION;  Surgeon: [name], MD;  Location: [facility];  Service: Orthopedics;  Laterality: Left;    Her Family History Is Significant For: Family History  Problem Relation Age of Onset   Migraines Mother    Arthritis Mother    Depression Mother    Drug abuse Mother    Migraines Maternal Grandmother    Arthritis Maternal Grandmother    COPD Maternal Grandmother    Depression Maternal Grandmother    Hearing loss Maternal Grandmother    Colon polyps Maternal Grandmother    Depression Father    Depression Sister    Asthma Sister    Early death Sister    Asthma Daughter    Asthma Son    Birth defects Son    Arthritis Maternal Grandfather    Heart disease Maternal Grandfather    Arthritis Paternal Grandmother    Depression Sister    Colon cancer Neg Hx    Esophageal cancer Neg Hx    Rectal cancer Neg Hx    Stomach cancer Neg Hx     Her Social History Is Significant For: Social History   Socioeconomic History   Marital status: Divorced    Spouse name: Not on file   Number of children: 2   Years of education: Not on file   Highest education level: Not on file  Occupational History   Not on file  Tobacco Use   Smoking status: Never   Smokeless tobacco: Never  Vaping Use   Vaping Use: Never used  Substance and Sexual Activity   Alcohol use: Yes    Comment: socially   Drug use: Yes    Types: Marijuana    Comment: marijuana daily use smokes last used 03-01-2022   Sexual activity: Not Currently  Other Topics Concern   Not on file  Social History Narrative   Not on file   Social Determinants of Health   Financial Resource Strain: Not on file  Food Insecurity: Not on file  Transportation Needs: Not on file  Physical Activity: Not on file  Stress: Not on file  Social Connections: Not on  file    Her Allergies Are:  Allergies  Allergen Reactions   Erythromycin Hives  :   Her Current Medications Are:  Outpatient Encounter Medications as of 06/19/2023  Medication Sig   etonogestrel-ethinyl estradiol (NUVARING) 0.12-0.015 MG/24HR vaginal ring Place 1 each vaginally every 28 (twenty-eight) days. Insert vaginally and leave in place for 3 consecutive weeks, then remove for 1 week.   OZEMPIC, 0.25 OR 0.5 MG/DOSE, 2 MG/3ML SOPN SMARTSIG:0.25 Milligram(s) SUB-Q Once a Week  phentermine (ADIPEX-P) 37.5 MG tablet Take 37.5 mg by mouth daily.   TRULANCE 3 MG TABS Take 1 tablet by mouth daily.   Vitamin D, Ergocalciferol, (DRISDOL) 1.25 MG (50000 UNIT) CAPS capsule Take 50,000 Units by mouth once a week.   aspirin EC 81 MG tablet Take 1 tablet (81 mg total) by mouth 2 (two) times daily. For DVT prophylaxis for 30 days after surgery.   ferrous sulfate 325 (65 FE) MG tablet Take 1 tablet (325 mg total) by mouth daily.   FLUoxetine (PROZAC) 20 MG capsule Take 1 capsule (20 mg total) by mouth daily. (Patient not taking: Reported on 06/19/2023)   HYDROcodone-acetaminophen (NORCO) 10-325 MG tablet Take 1 tablet by mouth every 6 (six) hours as needed for severe pain.   meloxicam (MOBIC) 15 MG tablet Take 1 tablet (15 mg total) by mouth daily. For pain and inflammation   ondansetron (ZOFRAN-ODT) 4 MG disintegrating tablet Take 1 tablet (4 mg total) by mouth 2 (two) times daily as needed for nausea or vomiting.   No facility-administered encounter medications on file as of 06/19/2023.  :   Review of Systems:  Out of a complete 14 point review of systems, all are reviewed and negative with the exception of these symptoms as listed below:  Review of Systems  Neurological:        Pt here for unresolved Bells Palsy Pt has drooping on left side of face . Pt mouth droops  to left side     Objective:  Neurological Exam  Physical Exam Physical Examination:   Vitals:   06/19/23 0819  BP:  108/64  Pulse: 63    General Examination: The patient is a very pleasant 47 y.o. female in no acute distress. She appears well-developed and well-nourished and well groomed.   HEENT: Normocephalic, atraumatic, pupils are equal, round and reactive to light, tracking is well-preserved, no significant Bell's phenomenon, eye closure is quite good on the left side and normal on the right.  She has mild facial asymmetry with lower motor neuron type weakness on the left, decreased forehead wrinkling also.  Speech is clear, no dysarthria, very slight impediment at times but very intelligible speech overall.  She has good hearing, tympanic membranes clear bilaterally, in particular, no blisters or redness or cerumen impaction.  Airway examination reveals mild mouth dryness, tongue protrudes very slightly to the right but tongue movements overall are good on each side.  Palate elevates symmetrically.  No carotid bruits.  Neck is supple with full range of motion.  Mildly dry eye on the left with slight excess and lacrimation noted.  No evidence of facial twitching or spasms or hemifacial spasm.  Chest: Clear to auscultation without wheezing, rhonchi or crackles noted.  Heart: S1+S2+0, regular and normal without murmurs, rubs or gallops noted.   Abdomen: Soft, non-tender and non-distended.  Extremities: There is no pitting edema in the distal lower extremities bilaterally.   Skin: Warm and dry without trophic changes noted.   Musculoskeletal: exam reveals no obvious joint deformities, except mild puffiness and unremarkable ankle scar laterally on the left from last year's surgery.   Neurologically:  Mental status: The patient is awake, alert and oriented in all 4 spheres. Her immediate and remote memory, attention, language skills and fund of knowledge are appropriate. There is no evidence of aphasia, agnosia, apraxia or anomia. Speech is clear with normal prosody and enunciation. Thought process is linear.  Mood is normal and affect is normal.  Cranial nerves II -  XII are as described above under HEENT exam.  Motor exam: Normal bulk, strength and tone is noted. There is no obvious action or resting tremor.  No drift or rebound, no postural tremor.  Reflexes 2+ throughout, toes are downgoing bilaterally. Fine motor skills and coordination: Intact finger taps, hand movements and rapid alternating patting bilaterally in the upper extremities, intact foot taps bilaterally in the lower extremities.    Cerebellar testing: No dysmetria or intention tremor. There is no truncal or gait ataxia.  Normal finger-to-nose, normal heel-to-shin bilaterally. Sensory exam: intact to light touch in the upper and lower extremities.  Gait, station and balance: She stands easily. No veering to one side is noted. No leaning to one side is noted. Posture is age-appropriate and stance is narrow based. Gait shows normal stride length and normal pace. No problems turning are noted.  Romberg negative, tandem walk unremarkable.  Assessment and Plan:   In summary, Xylah Jameyah Fennewald is a very pleasant 47 y.o.-year old female with an underlying medical history of anemia, chronic constipation, headaches, vitamin D deficiency, anxiety, depression, left ankle fracture with status post ORIF in March 2023, constipation, uterine leiomyoma, and obesity, who presents for evaluation of her Bell's palsy of over 10 years duration with residual deficit noted.  She overall improved to some degree but has since then plateaued for several years.  Neurological exam is in keeping with residual Bell's palsy but without any significant eye closure problem.  Still she does seem to have dry eyes and mild conjunctival irritation upon exam.  She is encouraged to use over-the-counter eyedrops for lubrication, she is advised that irritation of the eye including corneal irritation can be serious and Bell's palsy patients.  She is advised to circle back to her  ophthalmologist or eye doctor for this as well to get additional advice as she did not end up using the eyedrops she was given.  She is reassured that her neurological exam otherwise is nonfocal.  We talked about causes of Bell's palsy and the natural course.  There is no specific medication at this point that she take for Bell's palsy, no specific exercise is typically recommended at this point.  We will proceed with a brain MRI with and without contrast so as to rule out a structural cause of her symptoms.  She is advised that we will call her or email her through MyChart with her brain MRI results.  She does not need a scheduled follow-up in this clinic.  We talked about the importance of maintaining a healthy lifestyle in general.  Some people who develop facial twitching such as hemifacial spasm, or good candidates for Botox injections to the facial muscles but this is not indicated in her case at this time.   This was an extended visit of over 60 minutes due to extended chart review including prior neurology records reviewed.  I answered all her questions today and she was in agreement with our plan.   Thank you very much for allowing me to participate in the care of this nice patient. If I can be of any further assistance to you please do not hesitate to call me at [phone].  Sincerely,   Huston Foley, MD, PhD

## 2023-06-20 LAB — COMPREHENSIVE METABOLIC PANEL
ALT: 41 IU/L — ABNORMAL HIGH (ref 0–32)
AST: 33 IU/L (ref 0–40)
Albumin: 4.1 g/dL (ref 3.9–4.9)
Alkaline Phosphatase: 52 IU/L (ref 44–121)
BUN/Creatinine Ratio: 13 (ref 9–23)
BUN: 11 mg/dL (ref 6–24)
Bilirubin Total: <0.2 mg/dL (ref 0.0–1.2)
CO2: 22 mmol/L (ref 20–29)
Calcium: 9.2 mg/dL (ref 8.7–10.2)
Chloride: 107 mmol/L — ABNORMAL HIGH (ref 96–106)
Creatinine, Ser: 0.84 mg/dL (ref 0.57–1.00)
Globulin, Total: 2.9 g/dL (ref 1.5–4.5)
Glucose: 86 mg/dL (ref 70–99)
Potassium: 4.3 mmol/L (ref 3.5–5.2)
Sodium: 142 mmol/L (ref 134–144)
Total Protein: 7 g/dL (ref 6.0–8.5)
eGFR: 88 mL/min/{1.73_m2} (ref >59)

## 2023-06-21 NOTE — Addendum Note (Signed)
Addended by: Huston Foley on: 06/21/2023 07:46 AM   Modules accepted: Orders

## 2023-07-02 ENCOUNTER — Telehealth: Payer: Self-pay

## 2023-07-02 NOTE — Telephone Encounter (Signed)
Patient left voicemail advising she was calling us back to schedule an appointment. Please give her a call back at [phone]

## 2023-07-03 ENCOUNTER — Telehealth: Payer: Self-pay | Admitting: Neurology

## 2023-07-03 NOTE — Telephone Encounter (Signed)
She is scheduled on 7/10 at [facility]

## 2023-07-03 NOTE — Telephone Encounter (Signed)
Pt scheduled for 45 mins MR brain w/wo contrast at [facility] for 07/04/23 at 2pm  [employer] auth# [id number] (06/21/23-07/20/23)

## 2023-07-04 ENCOUNTER — Ambulatory Visit: Payer: BC Managed Care – PPO

## 2023-07-04 DIAGNOSIS — G510 Bell's palsy: Secondary | ICD-10-CM | POA: Diagnosis not present

## 2023-07-04 MED ORDER — GADOBENATE DIMEGLUMINE 529 MG/ML IV SOLN
20.0000 mL | Freq: Once | INTRAVENOUS | Status: AC | PRN
  Administered 2023-07-04: 20 mL via INTRAVENOUS

## 2023-07-06 NOTE — Telephone Encounter (Signed)
Pt called needing to speak to the RN about her MRI results. Pt states she has a question on a comment made in the results.

## 2023-07-09 NOTE — Telephone Encounter (Signed)
I spoke with the patient.  She states she received the MRI results and she understands that everything looked okay and she has been released back to her primary care.  Her specific question below has been conveyed to [place] through a MyChart message that she sent while she was on the phone.  I advised that the finding of partially empty sella may just be part of her anatomy or could be something we can see with increased cerebrospinal fluid but I advised the patient that I did not see anything in the MRI about concerns for too much cerebrospinal fluid. I told her I would send a message to Dr Frances Furbish so that if she has any further commenting we can certainly let her know.  Patient was very Adult nurse.

## 2023-07-09 NOTE — Telephone Encounter (Signed)
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 I recommend that she follow-up with her eye doctor for a full, dilated eye exam to make sure there is no evidence of fluid pressure on the eye nerves.

## 2023-07-09 NOTE — Telephone Encounter (Signed)
I called the pt back & LVM with office number for call back.

## 2024-07-22 ENCOUNTER — Other Ambulatory Visit: Payer: Self-pay | Admitting: Obstetrics and Gynecology

## 2024-07-22 DIAGNOSIS — Z1231 Encounter for screening mammogram for malignant neoplasm of breast: Secondary | ICD-10-CM

## 2024-10-28 ENCOUNTER — Ambulatory Visit

## 2024-11-18 ENCOUNTER — Ambulatory Visit
Admission: RE | Admit: 2024-11-18 | Discharge: 2024-11-18 | Disposition: A | Discharge status: Home / Self Care | Source: Ambulatory Visit | Attending: Obstetrics and Gynecology | Admitting: Obstetrics and Gynecology

## 2024-11-18 DIAGNOSIS — Z1231 Encounter for screening mammogram for malignant neoplasm of breast: Secondary | ICD-10-CM
